# Patient Record
Sex: Female | Born: 1985 | Race: White | Hispanic: No | Marital: Single | Smoking: Current every day smoker
Health system: Southern US, Community
[De-identification: ages and names within clinical notes are randomized; demographics above are authoritative.]

## PROBLEM LIST (undated history)

## (undated) DIAGNOSIS — D649 Anemia, unspecified: Secondary | ICD-10-CM

## (undated) DIAGNOSIS — B009 Herpesviral infection, unspecified: Secondary | ICD-10-CM

## (undated) DIAGNOSIS — N920 Excessive and frequent menstruation with regular cycle: Secondary | ICD-10-CM

## (undated) DIAGNOSIS — Z8619 Personal history of other infectious and parasitic diseases: Secondary | ICD-10-CM

## (undated) DIAGNOSIS — K219 Gastro-esophageal reflux disease without esophagitis: Secondary | ICD-10-CM

## (undated) DIAGNOSIS — Z87442 Personal history of urinary calculi: Secondary | ICD-10-CM

## (undated) HISTORY — DX: Excessive and frequent menstruation with regular cycle: N92.0

## (undated) HISTORY — DX: Herpesviral infection, unspecified: B00.9

---

## 2013-01-09 ENCOUNTER — Emergency Department: Payer: Self-pay | Admitting: Emergency Medicine

## 2013-01-09 LAB — COMPREHENSIVE METABOLIC PANEL
Albumin: 4.4 g/dL (ref 3.4–5.0)
Alkaline Phosphatase: 69 U/L (ref 50–136)
Co2: 25 mmol/L (ref 21–32)
Creatinine: 0.91 mg/dL (ref 0.60–1.30)
EGFR (Non-African Amer.): >60
Glucose: 90 mg/dL (ref 65–99)
Potassium: 3.8 mmol/L (ref 3.5–5.1)
Sodium: 138 mmol/L (ref 136–145)

## 2013-01-09 LAB — URINALYSIS, COMPLETE
Ketone: NEGATIVE
Ph: 8 (ref 4.5–8.0)
Specific Gravity: 1.016 (ref 1.003–1.030)
Squamous Epithelial: 21
WBC UR: 21 /HPF (ref 0–5)

## 2013-01-09 LAB — CBC
HCT: 46.4 % (ref 35.0–47.0)
MCH: 29.7 pg (ref 26.0–34.0)
RBC: 5.21 10*6/uL — ABNORMAL HIGH (ref 3.80–5.20)
WBC: 11.3 10*3/uL — ABNORMAL HIGH (ref 3.6–11.0)

## 2013-01-09 LAB — LIPASE, BLOOD: Lipase: 107 U/L (ref 73–393)

## 2013-01-09 LAB — PREGNANCY, URINE: Pregnancy Test, Urine: NEGATIVE m[IU]/mL

## 2015-09-22 ENCOUNTER — Encounter: Payer: Self-pay | Admitting: Emergency Medicine

## 2015-09-22 ENCOUNTER — Emergency Department
Admission: EM | Admit: 2015-09-22 | Discharge: 2015-09-22 | Disposition: A | Discharge status: Home / Self Care | Payer: Managed Care, Other (non HMO) | Attending: Emergency Medicine | Admitting: Emergency Medicine

## 2015-09-22 DIAGNOSIS — Z720 Tobacco use: Secondary | ICD-10-CM | POA: Diagnosis not present

## 2015-09-22 DIAGNOSIS — L509 Urticaria, unspecified: Secondary | ICD-10-CM | POA: Diagnosis not present

## 2015-09-22 DIAGNOSIS — F419 Anxiety disorder, unspecified: Secondary | ICD-10-CM | POA: Insufficient documentation

## 2015-09-22 DIAGNOSIS — R21 Rash and other nonspecific skin eruption: Secondary | ICD-10-CM | POA: Diagnosis present

## 2015-09-22 NOTE — Discharge Instructions (Signed)
Hives  Hives are itchy, red, puffy (swollen) areas of the skin. Hives can change in size and location on your body. Hives can come and go for hours, days, or weeks. Hives do not spread from person to person (noncontagious). Scratching, exercise, and stress can make your hives worse.  HOME CARE  · Avoid things that cause your hives (triggers).  · Take antihistamine medicines as told by your doctor. Do not drive while taking an antihistamine.  · Take any other medicines for itching as told by your doctor.  · Wear loose-fitting clothing.  · Keep all doctor visits as told.  GET HELP RIGHT AWAY IF:   · You have a fever.  · Your tongue or lips are puffy.  · You have trouble breathing or swallowing.  · You feel tightness in the throat or chest.  · You have belly (abdominal) pain.  · You have lasting or severe itching that is not helped by medicine.  · You have painful or puffy joints.  These problems may be the first sign of a life-threatening allergic reaction. Call your local emergency services (911 in [place]  MAKE SURE YOU:   · Understand these instructions.  · Will watch your condition.  · Will get help right away if you are not doing well or get worse.  Document Released: 09/14/2008 Document Revised: 06/06/2012 Document Reviewed: 02/29/2012  [employer]® Patient Information ©2015 [employer]. This information is not intended to replace advice given to you by your health care provider. Make sure you discuss any questions you have with your health care provider.

## 2015-09-22 NOTE — ED Notes (Signed)
Patient ambulatory to triage with steady gait, without difficulty or distress noted; pt reports itchy rash x 3 days with unknown cause; st relieved by benadryl but reoccurs

## 2015-09-22 NOTE — ED Provider Notes (Signed)
[facility] Emergency Department Provider Note  ____________________________________________  Time seen: 7 AM  I have reviewed the triage vital signs and the nursing notes.   HISTORY  Chief Complaint Allergic Reaction    HPI Teresa Bond is a 29 y.o. female who presents with a rash. She reports she just moved to [place] from [place] 3 days ago and developed an itchy red rash which has progressed to spread all over her body. She reports improved significantly with Benadryl. She has no history of allergic reactions. She has not had recent antibiotics. She does not know the trigger for this. No fevers no chills no intraoral swelling. No shortness of breath.     History reviewed. No pertinent past medical history.  There are no active problems to display for this patient.   Past Surgical History  Procedure Laterality Date  . Cesarean section      No current outpatient prescriptions on file.  Allergies Review of patient's allergies indicates no known allergies.  No family history on file.  Social History Social History  Substance Use Topics  . Smoking status: Current Every Day Smoker -- 0.50 packs/day    Types: Cigarettes  . Smokeless tobacco: None  . Alcohol Use: None    Review of Systems  Constitutional: Negative for fever. Eyes: Negative for visual changes. ENT: Negative for sore throat negative for swelling Cardiovascular: Negative for chest pain. Respiratory: Negative for shortness of breath. Gastrointestinal: Negative for abdominal pain, vomiting and diarrhea. Genitourinary: Negative for dysuria. Musculoskeletal: Negative for back pain. Skin: Positive for rash Neurological: Negative for headaches or focal weakness Psychiatric: Mild anxiety    ____________________________________________   PHYSICAL EXAM:  VITAL SIGNS: ED Triage Vitals  Enc Vitals Group     BP 09/22/15 0614 108/63 mmHg     Pulse Rate 09/22/15 0614 90     Resp 09/22/15 0614 18     Temp 09/22/15 0614 97.9 F (36.6 C)     Temp Source 09/22/15 0614 Oral     SpO2 09/22/15 0614 99 %     Weight 09/22/15 0614 135 lb (61.236 kg)     Height 09/22/15 0614  (1.626 m)     Head Cir --      Peak Flow --      Pain Score 09/22/15 0636 1     Pain Loc --      Pain Edu? --      Excl. in GC? --      Constitutional: Alert and oriented. Well appearing and in no distress. Eyes: Conjunctivae are normal.  ENT   Head: Normocephalic and atraumatic.   Mouth/Throat: Mucous membranes are moist. No stridor. No intraoral swelling Cardiovascular: Normal rate, regular rhythm. Normal and symmetric distal pulses are present in all extremities. No murmurs, rubs, or gallops. Respiratory: Normal respiratory effort without tachypnea nor retractions. Breath sounds are clear and equal bilaterally.  Gastrointestinal: Soft and non-tender in all quadrants. No distention. There is no CVA tenderness. Genitourinary: deferred Musculoskeletal: Nontender with normal range of motion in all extremities. No lower extremity tenderness nor edema. Neurologic:  Normal speech and language. No gross focal neurologic deficits are appreciated. Skin:  Skin is warm, dry. Patient with diffuse urticarial rash that is pruritic. Psychiatric: Mood and affect are normal. Patient exhibits appropriate insight and judgment.  ____________________________________________    LABS (pertinent positives/negatives)  Labs Reviewed - No data to display  ____________________________________________   EKG  None  ____________________________________________    RADIOLOGY  I have personally reviewed any xrays that were ordered on this patient:   ____________________________________________   PROCEDURES  Procedure(s) performed: none  Critical Care performed: none  ____________________________________________   INITIAL IMPRESSION / ASSESSMENT AND PLAN / ED COURSE  Pertinent labs  & imaging results that were available during my care of the patient were reviewed by me and considered in my medical decision making (see chart for details).  Patient well-appearing. Rash is definitely consistent with urticaria especially given improvement with Benadryl. Patient is approximately one month pregnant (she has no abdominal pain or vaginal bleeding ) and prednisone is not indicated in the first trimester pregnancy. I have asked her to continue taking Benadryl as needed for itching. She will follow-up. Return precautions cautions discussed including intraoral swelling or shortness of breath  ____________________________________________   FINAL CLINICAL IMPRESSION(S) / ED DIAGNOSES  Final diagnoses:  Urticaria     Jene Every, MD 09/22/15 [phone]

## 2015-10-13 ENCOUNTER — Emergency Department
Admission: EM | Admit: 2015-10-13 | Discharge: 2015-10-14 | Disposition: A | Discharge status: Home / Self Care | Payer: Managed Care, Other (non HMO) | Attending: Emergency Medicine | Admitting: Emergency Medicine

## 2015-10-13 ENCOUNTER — Encounter: Payer: Self-pay | Admitting: *Deleted

## 2015-10-13 ENCOUNTER — Emergency Department: Payer: Managed Care, Other (non HMO)

## 2015-10-13 DIAGNOSIS — F17210 Nicotine dependence, cigarettes, uncomplicated: Secondary | ICD-10-CM | POA: Diagnosis not present

## 2015-10-13 DIAGNOSIS — O209 Hemorrhage in early pregnancy, unspecified: Secondary | ICD-10-CM | POA: Diagnosis present

## 2015-10-13 DIAGNOSIS — Z3A00 Weeks of gestation of pregnancy not specified: Secondary | ICD-10-CM | POA: Diagnosis not present

## 2015-10-13 DIAGNOSIS — F419 Anxiety disorder, unspecified: Secondary | ICD-10-CM | POA: Insufficient documentation

## 2015-10-13 DIAGNOSIS — O99330 Smoking (tobacco) complicating pregnancy, unspecified trimester: Secondary | ICD-10-CM | POA: Diagnosis not present

## 2015-10-13 DIAGNOSIS — O99340 Other mental disorders complicating pregnancy, unspecified trimester: Secondary | ICD-10-CM | POA: Insufficient documentation

## 2015-10-13 DIAGNOSIS — O200 Threatened abortion: Secondary | ICD-10-CM

## 2015-10-13 DIAGNOSIS — O039 Complete or unspecified spontaneous abortion without complication: Secondary | ICD-10-CM

## 2015-10-13 LAB — HCG, QUANTITATIVE, PREGNANCY: hCG, Beta Chain, Quant, S: 10260 m[IU]/mL — ABNORMAL HIGH (ref <5)

## 2015-10-13 LAB — CBC
HEMATOCRIT: 37 % (ref 35.0–47.0)
HEMOGLOBIN: 12.2 g/dL (ref 12.0–16.0)
MCH: 25.4 pg — ABNORMAL LOW (ref 26.0–34.0)
MCHC: 32.8 g/dL (ref 32.0–36.0)
MCV: 77.4 fL — AB (ref 80.0–100.0)
Platelets: 270 10*3/uL (ref 150–440)
RBC: 4.79 MIL/uL (ref 3.80–5.20)
RDW: 21.6 % — AB (ref 11.5–14.5)
WBC: 9.8 10*3/uL (ref 3.6–11.0)

## 2015-10-13 LAB — ABO/RH: ABO/RH(D): A POS

## 2015-10-13 MED ORDER — ONDANSETRON HCL 4 MG/2ML IJ SOLN
INTRAMUSCULAR | Status: AC
  Administered 2015-10-13: 4 mg via INTRAVENOUS
  Filled 2015-10-13: qty 2

## 2015-10-13 MED ORDER — MORPHINE SULFATE (PF) 2 MG/ML IV SOLN
2.0000 mg | Freq: Once | INTRAVENOUS | Status: AC
  Administered 2015-10-13: 2 mg via INTRAVENOUS
  Filled 2015-10-13: qty 1

## 2015-10-13 MED ORDER — MORPHINE SULFATE (PF) 2 MG/ML IV SOLN
2.0000 mg | Freq: Once | INTRAVENOUS | Status: AC
  Administered 2015-10-13: 2 mg via INTRAVENOUS

## 2015-10-13 MED ORDER — ONDANSETRON HCL 4 MG/2ML IJ SOLN
4.0000 mg | Freq: Once | INTRAMUSCULAR | Status: AC
  Administered 2015-10-13: 4 mg via INTRAVENOUS

## 2015-10-13 MED ORDER — MORPHINE SULFATE (PF) 2 MG/ML IV SOLN
INTRAVENOUS | Status: AC
  Administered 2015-10-13: 2 mg via INTRAVENOUS
  Filled 2015-10-13: qty 1

## 2015-10-13 NOTE — ED Provider Notes (Signed)
[facility] Emergency Department Provider Note  ____________________________________________  Time seen: On arrival  I have reviewed the triage vital signs and the nursing notes.   HISTORY  Chief Complaint Vaginal Bleeding    HPI Teresa Bond is a 29 y.o. female who presents with vaginal bleeding. She notes that she is approximately 2 months pregnant and has not had any prenatall care. She is a G4 P2 with a prior miscarriage. She notes that she is passing significant blood and having lower abdominal and lower back cramping. She denies fevers chills. No nausea no vomiting. She reports this feels different than her last miscarriage     History reviewed. No pertinent past medical history.  There are no active problems to display for this patient.   Past Surgical History  Procedure Laterality Date  . Cesarean section      No current outpatient prescriptions on file.  Allergies Review of patient's allergies indicates no known allergies.  No family history on file.  Social History Social History  Substance Use Topics  . Smoking status: Current Every Day Smoker -- 0.50 packs/day    Types: Cigarettes  . Smokeless tobacco: None  . Alcohol Use: Yes    Review of Systems  Constitutional: Negative for fever. Eyes: Negative for visual changes. ENT: Negative for sore throat Cardiovascular: Negative for chest pain. Respiratory: Negative for shortness of breath. Gastrointestinal: positive for lower abdominal cramping Genitourinary: Negative for dysuria.positive for vaginal bleeding Musculoskeletal: Negative for back pain. Skin: Negative for rash. Neurological: Negative for headaches or focal weakness Psychiatric:positive for anxiety    ____________________________________________   PHYSICAL EXAM:  VITAL SIGNS: ED Triage Vitals  Enc Vitals Group     BP 10/13/15 2104 132/73 mmHg     Pulse Rate 10/13/15 2104 81     Resp 10/13/15 2104 18      Temp 10/13/15 2104 98.3 F (36.8 C)     Temp Source 10/13/15 2104 Oral     SpO2 10/13/15 2104 100 %     Weight 10/13/15 2104 135 lb (61.236 kg)     Height 10/13/15 2104  (1.626 m)     Head Cir --      Peak Flow --      Pain Score 10/13/15 2104 10     Pain Loc --      Pain Edu? --      Excl. in GC? --      Constitutional: Alert and oriented. Well appearing and in no distress. Eyes: Conjunctivae are normal.  ENT   Head: Normocephalic and atraumatic.   Mouth/Throat: Mucous membranes are moist. Cardiovascular: Normal rate, regular rhythm. Normal and symmetric distal pulses are present in all extremities. No murmurs, rubs, or gallops. Respiratory: Normal respiratory effort without tachypnea nor retractions. Breath sounds are clear and equal bilaterally.  Gastrointestinal: Soft and non-tender in all quadrants. No distention. There is no CVA tenderness. Genitourinary: deferred Musculoskeletal: Nontender with normal range of motion in all extremities. No lower extremity tenderness nor edema. Neurologic:  Normal speech and language. No gross focal neurologic deficits are appreciated. Skin:  Skin is warm, dry and intact. No rash noted. Psychiatric: patient is anxious and uncomfortable  ____________________________________________    LABS (pertinent positives/negatives)  Labs Reviewed  HCG, QUANTITATIVE, PREGNANCY - Abnormal; Notable for the following:    hCG, Beta Chain, Quant, S 10260 (*)    All other components within normal limits  CBC - Abnormal; Notable for the following:    MCV  77.4 (*)    MCH 25.4 (*)    RDW 21.6 (*)    All other components within normal limits  ABO/RH    ____________________________________________   EKG  None  ____________________________________________    RADIOLOGY I have personally reviewed any xrays that were ordered on this patient: Ultrasound  pending  ____________________________________________   PROCEDURES  Procedure(s) performed: none  Critical Care performed: none  ____________________________________________   INITIAL IMPRESSION / ASSESSMENT AND PLAN / ED COURSE  Pertinent labs & imaging results that were available during my care of the patient were reviewed by me and considered in my medical decision making (see chart for details).  Patient presents with vaginal bleeding approximately [redacted] weeks pregnant. She is having significant cramping. The symptoms are concerning for miscarriage nand I have communicated this to the patient. We'll obtain ultrasound to evaluate  Patient is so uncomfortable that she is required 2 doses of morphine. She reports she continues to bleed.  Ultrasound pending. I will sign out tto Dr. Manson PasseyBrown and asked him to follow up the ultrasound results  ____________________________________________   FINAL CLINICAL IMPRESSION(S) / ED DIAGNOSES  Threatened miscarriage   Jene Everyobert Kiyomi Pallo, MD 10/13/15 2315

## 2015-10-13 NOTE — ED Notes (Signed)
Pt reports bleeding x 2 days with clots starting today. Pt reports being approx 2 months pregnant with no prenatal care due to recent move.

## 2015-10-13 NOTE — ED Notes (Signed)
Pt crying in room, MD at bedside. Pt continues to cry, mother has been out of room several times.

## 2015-10-13 NOTE — ED Notes (Addendum)
Pt says for two days she has had dark red vaginal bleeding with large clots. About 2 hours ago she experienced a "gushing" of blood. Having lower abdominal and lower back pain. Pt is pregnant, EDD 02/25/2016, has not established prenatal care yet d/t recent move. Pt reports changing her pad approx every 2 hours since the bleeding started. G4P2 Pt was small amount of bright red blood, no clotting noted on sanitary pad in triage.

## 2015-10-14 NOTE — Discharge Instructions (Signed)
Miscarriage  A miscarriage is the sudden loss of an unborn baby (fetus) before the 20th week of pregnancy. Most miscarriages happen in the first 3 months of pregnancy. Sometimes, it happens before a woman even knows she is pregnant. A miscarriage is also called a "spontaneous miscarriage" or "early pregnancy loss." Having a miscarriage can be an emotional experience. Talk with your caregiver about any questions you may have about miscarrying, the grieving process, and your future pregnancy plans.  CAUSES    Problems with the fetal chromosomes that make it impossible for the baby to develop normally. Problems with the baby's genes or chromosomes are most often the result of errors that occur, by chance, as the embryo divides and grows. The problems are not inherited from the parents.   Infection of the cervix or uterus.    Hormone problems.    Problems with the cervix, such as having an incompetent cervix. This is when the tissue in the cervix is not strong enough to hold the pregnancy.    Problems with the uterus, such as an abnormally shaped uterus, uterine fibroids, or congenital abnormalities.    Certain medical conditions.    Smoking, drinking alcohol, or taking illegal drugs.    Trauma.   Often, the cause of a miscarriage is unknown.   SYMPTOMS    Vaginal bleeding or spotting, with or without cramps or pain.   Pain or cramping in the abdomen or lower back.   Passing fluid, tissue, or blood clots from the vagina.  DIAGNOSIS   Your caregiver will perform a physical exam. You may also have an ultrasound to confirm the miscarriage. Blood or urine tests may also be ordered.  TREATMENT    Sometimes, treatment is not necessary if you naturally pass all the fetal tissue that was in the uterus. If some of the fetus or placenta remains in the body (incomplete miscarriage), tissue left behind may become infected and must be removed. Usually, a dilation and curettage (D and C) procedure is performed.  During a D and C procedure, the cervix is widened (dilated) and any remaining fetal or placental tissue is gently removed from the uterus.   Antibiotic medicines are prescribed if there is an infection. Other medicines may be given to reduce the size of the uterus (contract) if there is a lot of bleeding.   If you have Rh negative blood and your baby was Rh positive, you will need a Rh immunoglobulin shot. This shot will protect any future baby from having Rh blood problems in future pregnancies.  HOME CARE INSTRUCTIONS    Your caregiver may order bed rest or may allow you to continue light activity. Resume activity as directed by your caregiver.   Have someone help with home and family responsibilities during this time.    Keep track of the number of sanitary pads you use each day and how soaked (saturated) they are. Write down this information.    Do not use tampons. Do not douche or have sexual intercourse until approved by your caregiver.    Only take over-the-counter or prescription medicines for pain or discomfort as directed by your caregiver.    Do not take aspirin. Aspirin can cause bleeding.    Keep all follow-up appointments with your caregiver.    If you or your partner have problems with grieving, talk to your caregiver or seek counseling to help cope with the pregnancy loss. Allow enough time to grieve before trying to get pregnant again.     SEEK IMMEDIATE MEDICAL CARE IF:    You have severe cramps or pain in your back or abdomen.   You have a fever.   You pass large blood clots (walnut-sized or larger) ortissue from your vagina. Save any tissue for your caregiver to inspect.    Your bleeding increases.    You have a thick, bad-smelling vaginal discharge.   You become lightheaded, weak, or you faint.    You have chills.   MAKE SURE YOU:   Understand these instructions.   Will watch your condition.   Will get help right away if you are not doing well or get worse.     This  information is not intended to replace advice given to you by your health care provider. Make sure you discuss any questions you have with your health care provider.     Document Released: 06/01/2001 Document Revised: 04/02/2013 Document Reviewed: 01/25/2012  Elsevier Interactive Patient Education 2016 [employer]

## 2015-10-14 NOTE — ED Notes (Signed)

## 2016-09-16 IMAGING — US US OB COMP LESS 14 WK
1 series · 13 of 28 positions shown · non-contrast
Comparison: None.

CLINICAL DATA: Acute onset of vaginal bleeding and pelvic cramping.
Initial encounter.

EXAM:
OBSTETRIC <14 WK US AND TRANSVAGINAL OB US
TECHNIQUE: Both transabdominal and transvaginal ultrasound examinations were
performed for complete evaluation of the gestation as well as the
maternal uterus, adnexal regions, and pelvic cul-de-sac.
Transvaginal technique was performed to assess early pregnancy.

[Series 1: us ob comp less 14 wk · 0.24mm/px · 13 of 71 slices shown]
[im 3/71]
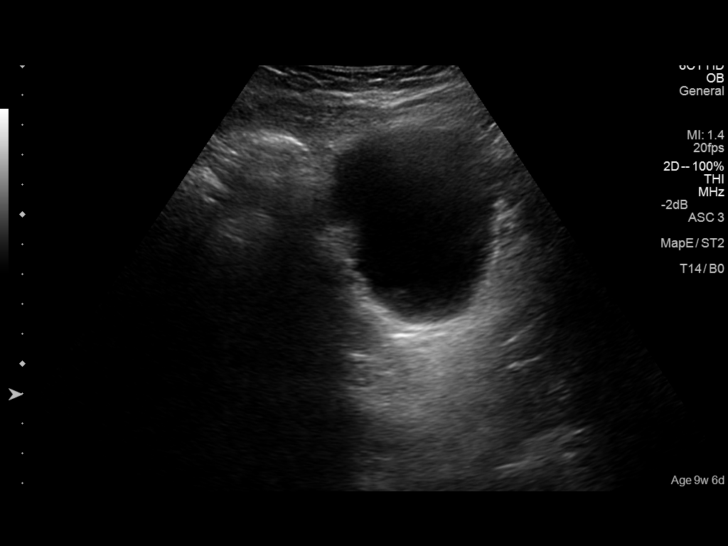
[im 8/71]
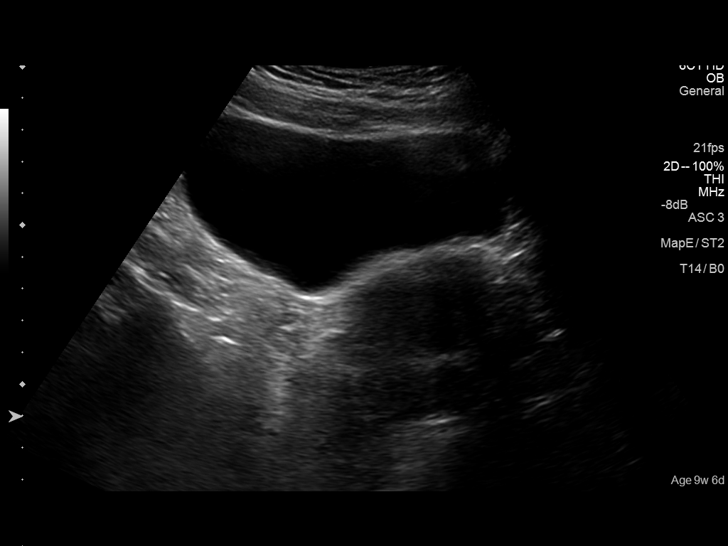
[im 13/71]
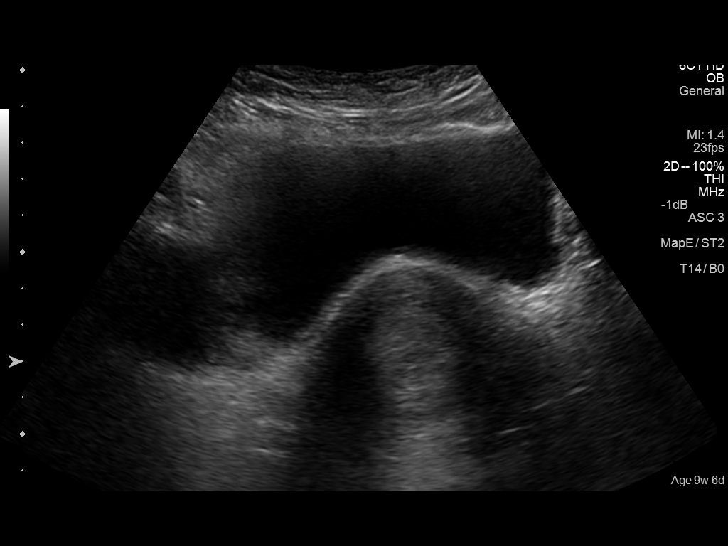
[im 19/71]
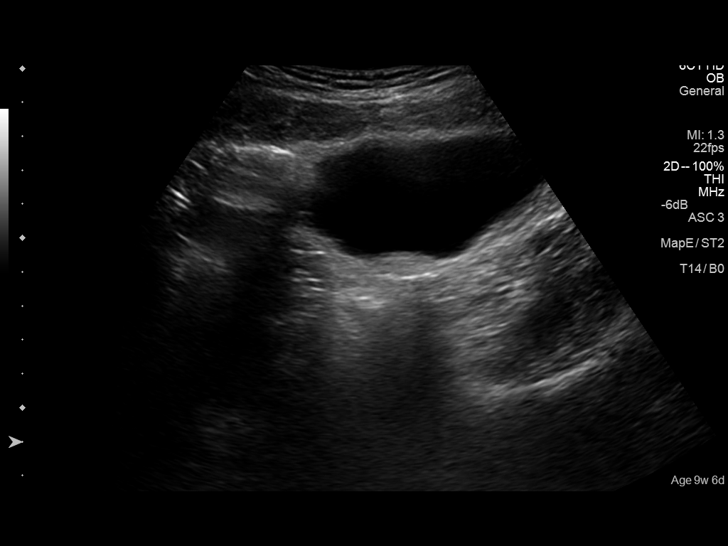
[im 24/71]
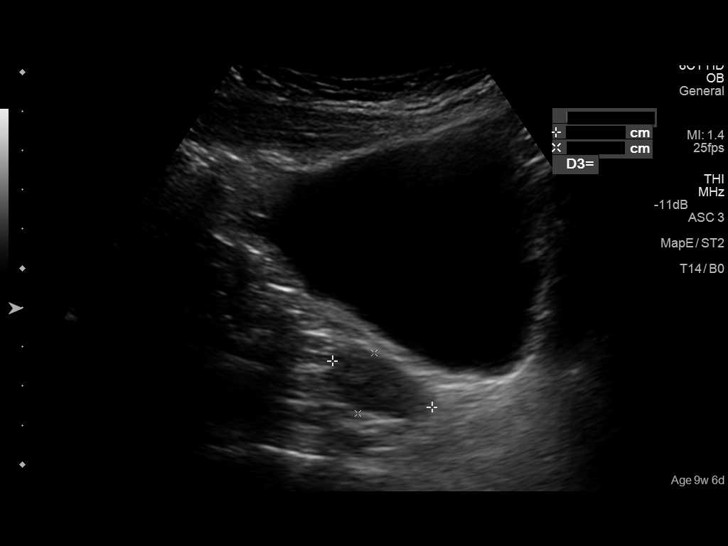
[im 29/71]
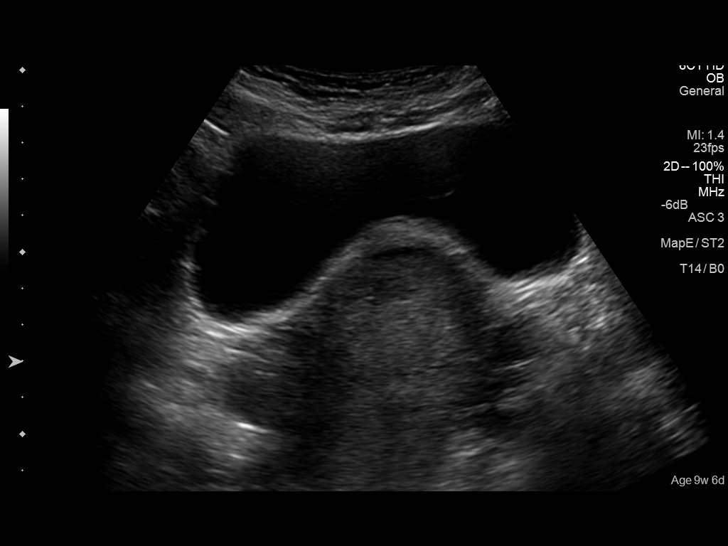
[im 37/71]
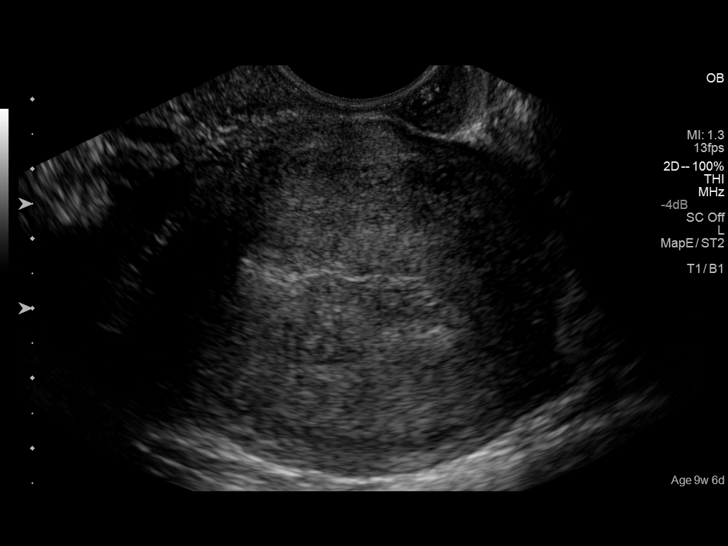
[im 42/71]
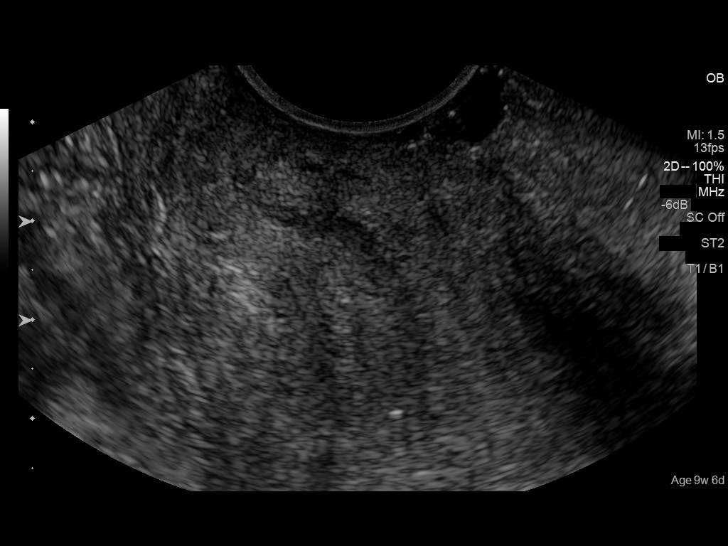
[im 47/71]
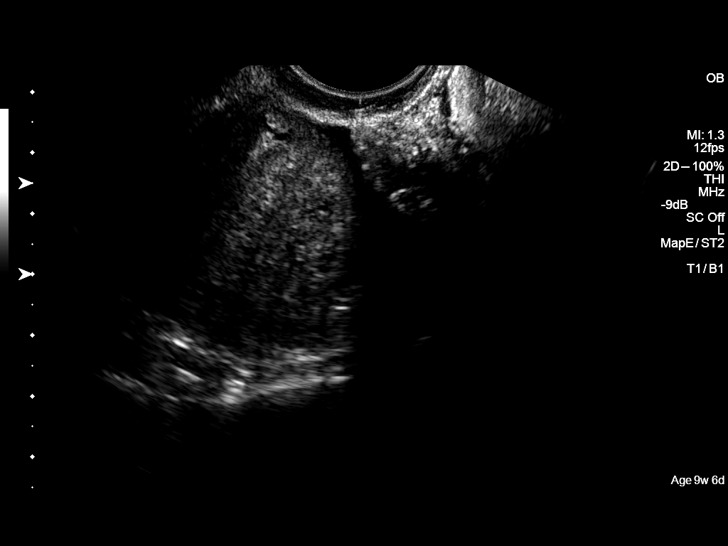
[im 52/71]
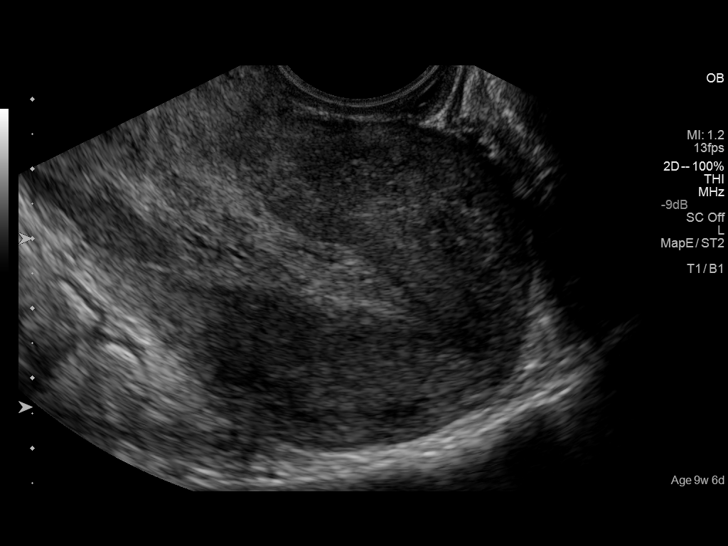
[im 58/71]
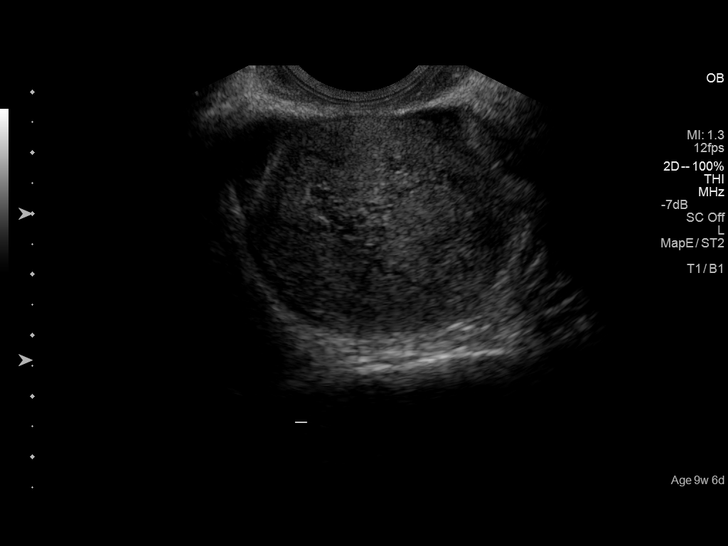
[im 63/71]
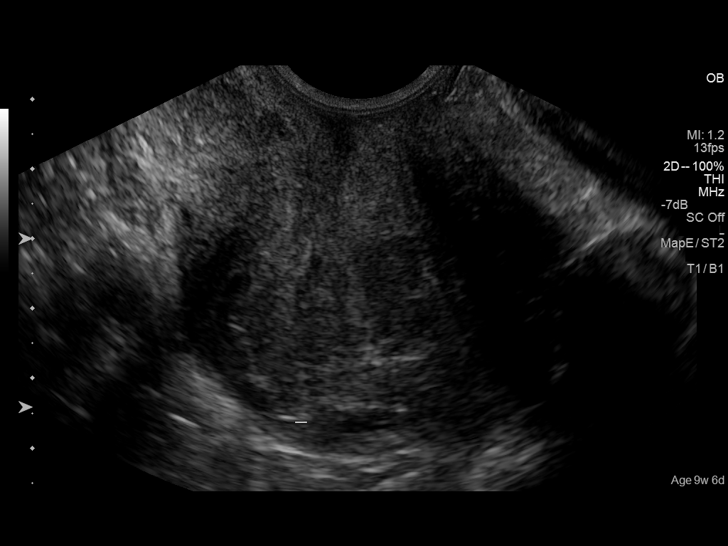
[im 68/71]
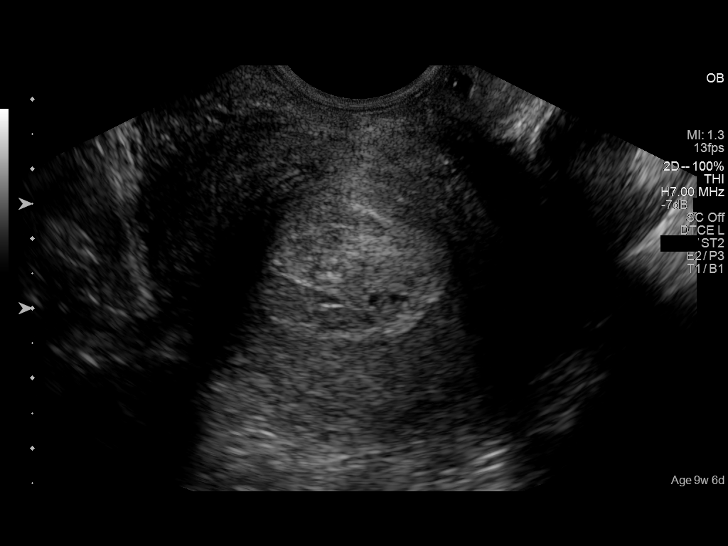

[13 of 28 positions shown; findings below may reference images not displayed]

FINDINGS: Intrauterine gestational sac: None seen.

Yolk sac:  N/A

Embryo:  N/A

Maternal uterus/adnexae: No subchorionic hemorrhage is noted. There
is no evidence of retained products of conception. The uterus is
retroverted in nature. The endometrial echo complex is thickened,
measuring up to 1.6 cm, with mildly echogenic material extending
into the cervix, likely reflecting clot and blood.

The ovaries are unremarkable in appearance. The right ovary measures
assess 2.8 x 1.6 x 2.2 cm, while the left ovary measures 2.1 x 1.4 x
1.8 cm. No suspicious adnexal masses are seen; there is no evidence
for ovarian torsion.

No free fluid is seen within the pelvic cul-de-sac.
IMPRESSION: No intrauterine gestational sac seen. Blood and clot noted within
the endometrial canal and cervix. No evidence for retained products
of conception. Findings are compatible with recent spontaneous
abortion.

## 2018-01-12 ENCOUNTER — Other Ambulatory Visit: Payer: Self-pay

## 2018-01-12 ENCOUNTER — Emergency Department: Payer: 59

## 2018-01-12 ENCOUNTER — Emergency Department
Admission: EM | Admit: 2018-01-12 | Discharge: 2018-01-12 | Disposition: A | Discharge status: Home / Self Care | Payer: 59 | Attending: Emergency Medicine | Admitting: Emergency Medicine

## 2018-01-12 DIAGNOSIS — B373 Candidiasis of vulva and vagina: Secondary | ICD-10-CM | POA: Insufficient documentation

## 2018-01-12 DIAGNOSIS — R1030 Lower abdominal pain, unspecified: Secondary | ICD-10-CM | POA: Insufficient documentation

## 2018-01-12 DIAGNOSIS — F17210 Nicotine dependence, cigarettes, uncomplicated: Secondary | ICD-10-CM | POA: Diagnosis not present

## 2018-01-12 DIAGNOSIS — Z3A09 9 weeks gestation of pregnancy: Secondary | ICD-10-CM | POA: Diagnosis not present

## 2018-01-12 DIAGNOSIS — N2889 Other specified disorders of kidney and ureter: Secondary | ICD-10-CM

## 2018-01-12 DIAGNOSIS — O99331 Smoking (tobacco) complicating pregnancy, first trimester: Secondary | ICD-10-CM | POA: Insufficient documentation

## 2018-01-12 DIAGNOSIS — O9989 Other specified diseases and conditions complicating pregnancy, childbirth and the puerperium: Secondary | ICD-10-CM | POA: Insufficient documentation

## 2018-01-12 DIAGNOSIS — O210 Mild hyperemesis gravidarum: Secondary | ICD-10-CM | POA: Diagnosis not present

## 2018-01-12 DIAGNOSIS — O23591 Infection of other part of genital tract in pregnancy, first trimester: Secondary | ICD-10-CM | POA: Diagnosis not present

## 2018-01-12 DIAGNOSIS — B3731 Acute candidiasis of vulva and vagina: Secondary | ICD-10-CM

## 2018-01-12 DIAGNOSIS — O219 Vomiting of pregnancy, unspecified: Secondary | ICD-10-CM | POA: Diagnosis present

## 2018-01-12 DIAGNOSIS — F1290 Cannabis use, unspecified, uncomplicated: Secondary | ICD-10-CM | POA: Insufficient documentation

## 2018-01-12 DIAGNOSIS — O99321 Drug use complicating pregnancy, first trimester: Secondary | ICD-10-CM | POA: Insufficient documentation

## 2018-01-12 LAB — CBC
HEMATOCRIT: 38.9 % (ref 35.0–47.0)
HEMOGLOBIN: 13.5 g/dL (ref 12.0–16.0)
MCH: 30.5 pg (ref 26.0–34.0)
MCHC: 34.7 g/dL (ref 32.0–36.0)
MCV: 87.9 fL (ref 80.0–100.0)
Platelets: 230 10*3/uL (ref 150–440)
RBC: 4.42 MIL/uL (ref 3.80–5.20)
RDW: 13.6 % (ref 11.5–14.5)
WBC: 7.4 10*3/uL (ref 3.6–11.0)

## 2018-01-12 LAB — URINALYSIS, COMPLETE (UACMP) WITH MICROSCOPIC
BACTERIA UA: NONE SEEN
BILIRUBIN URINE: NEGATIVE
Bilirubin Urine: NEGATIVE
Glucose, UA: NEGATIVE mg/dL
Glucose, UA: NEGATIVE mg/dL
HGB URINE DIPSTICK: NEGATIVE
KETONES UR: 5 mg/dL — AB
Ketones, ur: 20 mg/dL — AB
NITRITE: NEGATIVE
NITRITE: NEGATIVE
PH: 6 (ref 5.0–8.0)
PROTEIN: 30 mg/dL — AB
PROTEIN: NEGATIVE mg/dL
Specific Gravity, Urine: 1.019 (ref 1.005–1.030)
Specific Gravity, Urine: 1.016 (ref 1.005–1.030)
pH: 6 (ref 5.0–8.0)

## 2018-01-12 LAB — HCG, QUANTITATIVE, PREGNANCY: hCG, Beta Chain, Quant, S: 261744 m[IU]/mL — ABNORMAL HIGH (ref <5)

## 2018-01-12 LAB — POCT PREGNANCY, URINE: Preg Test, Ur: POSITIVE — AB

## 2018-01-12 LAB — WET PREP, GENITAL
Clue Cells Wet Prep HPF POC: NONE SEEN
SPERM: NONE SEEN
TRICH WET PREP: NONE SEEN

## 2018-01-12 LAB — COMPREHENSIVE METABOLIC PANEL
ALBUMIN: 4 g/dL (ref 3.5–5.0)
ALT: 8 U/L — ABNORMAL LOW (ref 14–54)
ANION GAP: 7 (ref 5–15)
AST: 18 U/L (ref 15–41)
Alkaline Phosphatase: 43 U/L (ref 38–126)
BILIRUBIN TOTAL: 0.7 mg/dL (ref 0.3–1.2)
BUN: 9 mg/dL (ref 6–20)
CO2: 21 mmol/L — ABNORMAL LOW (ref 22–32)
Calcium: 9.3 mg/dL (ref 8.9–10.3)
Chloride: 107 mmol/L (ref 101–111)
Creatinine, Ser: 0.67 mg/dL (ref 0.44–1.00)
GFR calc Af Amer: >60 mL/min (ref >60)
Glucose, Bld: 92 mg/dL (ref 65–99)
POTASSIUM: 3.7 mmol/L (ref 3.5–5.1)
Sodium: 135 mmol/L (ref 135–145)
TOTAL PROTEIN: 7 g/dL (ref 6.5–8.1)

## 2018-01-12 LAB — CHLAMYDIA/NGC RT PCR (ARMC ONLY)
Chlamydia Tr: NOT DETECTED
N gonorrhoeae: NOT DETECTED

## 2018-01-12 LAB — ABO/RH: ABO/RH(D): A POS

## 2018-01-12 LAB — LIPASE, BLOOD: Lipase: 19 U/L (ref 11–51)

## 2018-01-12 MED ORDER — VITAMIN B-6 50 MG PO TABS
50.0000 mg | ORAL_TABLET | Freq: Every day | ORAL | Status: DC
  Administered 2018-01-12: 50 mg via ORAL
  Filled 2018-01-12: qty 1

## 2018-01-12 MED ORDER — SODIUM CHLORIDE 0.9 % IV BOLUS (SEPSIS)
1000.0000 mL | Freq: Once | INTRAVENOUS | Status: AC
  Administered 2018-01-12: 1000 mL via INTRAVENOUS

## 2018-01-12 MED ORDER — MICONAZOLE NITRATE 100 MG VA SUPP: 100.0000 mg | Freq: Every day | VAGINAL | 0 refills | Status: DC

## 2018-01-12 MED ORDER — PROMETHAZINE HCL 25 MG/ML IJ SOLN
12.5000 mg | Freq: Once | INTRAMUSCULAR | Status: AC
  Administered 2018-01-12: 12.5 mg via INTRAVENOUS
  Filled 2018-01-12: qty 1

## 2018-01-12 MED ORDER — TERCONAZOLE 80 MG VA SUPP: 80.0000 mg | Freq: Every day | VAGINAL | 0 refills | Status: DC

## 2018-01-12 MED ORDER — ACETAMINOPHEN 325 MG PO TABS
650.0000 mg | ORAL_TABLET | Freq: Once | ORAL | Status: AC
  Administered 2018-01-12: 650 mg via ORAL
  Filled 2018-01-12: qty 2

## 2018-01-12 MED ORDER — VITAMIN B-6 50 MG PO TABS: 50.0000 mg | ORAL_TABLET | Freq: Three times a day (TID) | ORAL | 0 refills | Status: DC | PRN

## 2018-01-12 NOTE — Discharge Instructions (Signed)
Today your ultrasound showed a possible mass in your left kidney, which is likely benign.  This will need to be followed up with an additional ultrasound; please talk to your physician about this.  Please take a clear liquid diet for the next 12 hours, then advance to bland diet as tolerated.  You may take your previously prescribed Zofran for nausea and vomiting, as well as the vitamin B6.  Please make a follow-up appointment with your OB/GYN, Dr. Valentino Saxonherry.  She will be able to review your ultrasound results.  Stop using marijuana.  Return to the emergency department if you develop severe pain, fever, lightheadedness or fainting, vaginal bleeding, or any other symptoms concerning to you.

## 2018-01-12 NOTE — ED Triage Notes (Addendum)
Pt presents to ED with c/o white vaginal discharge, sharp lower abd pain that started tonight after vomiting. pt reports she has been vomiting the past couple of weeks but states has gotten worse the past few days. Pt is approx 2 months pregnant.

## 2018-01-12 NOTE — ED Notes (Signed)
Dr Sharma Covertnorman at bedside to update pt and family

## 2018-01-12 NOTE — ED Notes (Signed)
Pt aware of need for urine specimen. Has wipes and aware of how to do clean catch.

## 2018-01-12 NOTE — ED Provider Notes (Signed)
[facility] Emergency Department Provider Note  ____________________________________________  Time seen: Approximately 7:19 AM  I have reviewed the triage vital signs and the nursing notes.   HISTORY  Chief Complaint Emesis During Pregnancy; Abdominal Pain; and Vaginal Discharge    HPI Teresa Bond is a 32 y.o. female G5P2A2 approximately [redacted] weeks pregnant presenting w/ emesis, abd pain and vaginal discharge.  The pt reports that for several weeks he has been experiencing multiple daily episodes of nausea and vomiting, but feels that this has been worse over the last 2 or 3 days.  She has tried marijuana without improvement.  She was given a prescription for Zofran by her gynecologist, but did not try it because she was concerned about birth defects that she read about on the Internet.  The patient also reports that for the past several weeks she has post vomiting abdominal pain.  She describes a pain that starts in the left side and radiates to the right side.  This does not occur outside of vomiting; she has no associated vaginal bleeding.  She does describe an increased amount of white vaginal discharge.  She has not had any fever or chills, lightheadedness or syncope.  History reviewed. No pertinent past medical history.  There are no active problems to display for this patient.   Past Surgical History:  Procedure Laterality Date  . CESAREAN SECTION        Allergies Patient has no known allergies.  No family history on file.  Social History Social History   Tobacco Use  . Smoking status: Current Every Day Smoker    Packs/day: 0.50    Types: Cigarettes  Substance Use Topics  . Alcohol use: No    Frequency: Never  . Drug use: Yes    Types: Marijuana    Review of Systems Constitutional: No fever/chills.  No lightheadedness or syncope.   Eyes: No visual changes. ENT: No congestion or rhinorrhea. Cardiovascular: Denies chest pain. Denies  palpitations. Respiratory: Denies shortness of breath.  No cough. Gastrointestinal: As of lower abdominal pain.  +nausea, +vomiting.  No diarrhea.  No constipation. Genitourinary: Negative for dysuria.  Positive for white vaginal discharge.  Negative for vaginal bleeding. Musculoskeletal: Negative for back pain. Skin: Negative for rash. Neurological: Negative for headaches. No focal numbness, tingling or weakness.     ____________________________________________   PHYSICAL EXAM:  VITAL SIGNS: ED Triage Vitals  Enc Vitals Group     BP 01/12/18 0613 100/72     Pulse Rate 01/12/18 0613 86     Resp 01/12/18 0613 20     Temp 01/12/18 0613 98.2 F (36.8 C)     Temp Source 01/12/18 0613 Oral     SpO2 01/12/18 0613 98 %     Weight 01/12/18 0619 130 lb (59 kg)     Height 01/12/18 0619 5\' 4"  (1.626 m)     Head Circumference --      Peak Flow --      Pain Score --      Pain Loc --      Pain Edu? --      Excl. in GC? --     Constitutional: Alert and oriented. Well appearing and in no acute distress. Answers questions appropriately. Eyes: Conjunctivae are normal.  EOMI. No scleral icterus. Head: Atraumatic. Nose: No congestion/rhinnorhea. Mouth/Throat: Mucous membranes are dry.  Neck: No stridor.  Supple.  No JVD.  No meningismus. Cardiovascular: Normal rate, regular rhythm. No murmurs, rubs or  gallops.  Respiratory: Normal respiratory effort.  No accessory muscle use or retractions. Lungs CTAB.  No wheezes, rales or ronchi. Gastrointestinal: Soft, nontender and nondistended.  No guarding or rebound.  No peritoneal signs. Genitourinary: Normal-appearing external genitalia without lesions. Normal vaginal exam with physiologic discharge, normal-appearing cervix, normal vaginal wall tissue. Bimanual exam is negative for CMT, adnexal tenderness to palpation, no palpable masses. Musculoskeletal: No LE edema.  Neurologic:  A&Ox3.  Speech is clear.  Face and smile are symmetric.  EOMI.   Moves all extremities well. Skin:  Skin is warm, dry and intact. No rash noted. Psychiatric: Mood and affect are normal. Speech and behavior are normal.  Normal judgement.  ____________________________________________   LABS (all labs ordered are listed, but only abnormal results are displayed)  Labs Reviewed  WET PREP, GENITAL - Abnormal; Notable for the following components:      Result Value   Yeast Wet Prep HPF POC PRESENT (*)    WBC, Wet Prep HPF POC FEW (*)    All other components within normal limits  URINALYSIS, COMPLETE (UACMP) WITH MICROSCOPIC - Abnormal; Notable for the following components:   Color, Urine AMBER (*)    APPearance CLOUDY (*)    Hgb urine dipstick SMALL (*)    Ketones, ur 5 (*)    Protein, ur 30 (*)    Leukocytes, UA MODERATE (*)    Bacteria, UA RARE (*)    Squamous Epithelial / LPF TOO NUMEROUS TO COUNT (*)    All other components within normal limits  HCG, QUANTITATIVE, PREGNANCY - Abnormal; Notable for the following components:   hCG, Beta Chain, Quant, S 409,811 (*)    All other components within normal limits  COMPREHENSIVE METABOLIC PANEL - Abnormal; Notable for the following components:   CO2 21 (*)    ALT 8 (*)    All other components within normal limits  URINALYSIS, COMPLETE (UACMP) WITH MICROSCOPIC - Abnormal; Notable for the following components:   Color, Urine YELLOW (*)    APPearance HAZY (*)    Ketones, ur 20 (*)    Leukocytes, UA TRACE (*)    Squamous Epithelial / LPF 0-5 (*)    All other components within normal limits  POCT PREGNANCY, URINE - Abnormal; Notable for the following components:   Preg Test, Ur POSITIVE (*)    All other components within normal limits  CHLAMYDIA/NGC RT PCR ([facility] ONLY)  URINE CULTURE  LIPASE, BLOOD  CBC  POC URINE PREG, ED  ABO/RH   ____________________________________________  EKG  Not indicated ____________________________________________  RADIOLOGY  US Ob Comp Less 14  Wks  Result Date: 01/12/2018 CLINICAL DATA:  Abdominal pain EXAM: OBSTETRIC <14 WK [place] AND TRANSVAGINAL OB US TECHNIQUE: Both transabdominal and transvaginal ultrasound examinations were performed for complete evaluation of the gestation as well as the maternal uterus, adnexal regions, and pelvic cul-de-sac. Transvaginal technique was performed to assess early pregnancy. COMPARISON:  None. FINDINGS: Intrauterine gestational sac: Single Yolk sac:  Visualized Embryo:  Visualized Cardiac Activity: Visualized Heart Rate: 185 bpm MSD:   mm    w     d CRL:  23.5 mm   9 w   0 d                  [place] EDC: 08/17/2018 Subchorionic hemorrhage:  None visualized. Maternal uterus/adnexae: Complex exophytic mass off the left ovary measures 3.6 x 2.4 x 2.8 cm. This contains cystic components and solid components, some which are echogenic. This  could represent hemorrhagic cyst or dermoid. IMPRESSION: Nine week intrauterine pregnancy. Fetal heart rate 185 beats per minute. Complex exophytic lesion off the left kidney with cystic and solid components. This could represent a hemorrhagic cyst or dermoid cyst. This could be followed with repeat ultrasound after pregnancy. Electronically Signed   By: Charlett Nose M.D.   On: 01/12/2018 08:40   US Ob Transvaginal  Result Date: 01/12/2018 CLINICAL DATA:  Abdominal pain EXAM: OBSTETRIC <14 WK [place] AND TRANSVAGINAL OB US TECHNIQUE: Both transabdominal and transvaginal ultrasound examinations were performed for complete evaluation of the gestation as well as the maternal uterus, adnexal regions, and pelvic cul-de-sac. Transvaginal technique was performed to assess early pregnancy. COMPARISON:  None. FINDINGS: Intrauterine gestational sac: Single Yolk sac:  Visualized Embryo:  Visualized Cardiac Activity: Visualized Heart Rate: 185 bpm MSD:   mm    w     d CRL:  23.5 mm   9 w   0 d                  [place] EDC: 08/17/2018 Subchorionic hemorrhage:  None visualized. Maternal uterus/adnexae: Complex  exophytic mass off the left ovary measures 3.6 x 2.4 x 2.8 cm. This contains cystic components and solid components, some which are echogenic. This could represent hemorrhagic cyst or dermoid. IMPRESSION: Nine week intrauterine pregnancy. Fetal heart rate 185 beats per minute. Complex exophytic lesion off the left kidney with cystic and solid components. This could represent a hemorrhagic cyst or dermoid cyst. This could be followed with repeat ultrasound after pregnancy. Electronically Signed   By: Charlett Nose M.D.   On: 01/12/2018 08:40    ____________________________________________   PROCEDURES  Procedure(s) performed: None  Procedures  Critical Care performed: No ____________________________________________   INITIAL IMPRESSION / ASSESSMENT AND PLAN / ED COURSE  Pertinent labs & imaging results that were available during my care of the patient were reviewed by me and considered in my medical decision making (see chart for details).  32 y.o. female G36P2 A2 approximately [redacted] weeks pregnant presenting with increased emesis, abdominal pain, and white vaginal discharge.  Overall, the patient is hemodynamically stable and afebrile.  We will plan to proceed with STI testing, and ultrasound to confirm intrauterine pregnancy, and a urinalysis to rule out UTI.  Patient will be treated symptomatically, and has already received 500 cc of fluid and Phenergan and is feeling better.  Plan reevaluation for final disposition.  ----------------------------------------- 11:35 AM on 01/12/2018 -----------------------------------------  The patient continues to remain hemodynamically stable and afebrile.  She is feeling significantly better at this time and is able to tolerate liquids without difficulty.  Her nausea has completely resolved.  Her results are positive for vaginal candidiasis.  Her repeat urinalysis does not have any bacteria, but it does have leukocyte esterase so I have sent it for  culture.  I have spoken with the patient's OB/GYN at [facility] clinic.  This OB/GYN is transferring the patient's care to Dr. Valentino Saxon, as she does not deliver babies.  We have reviewed the patient's workup, and she agrees with no treatment for the leukocyte esterase in the urine, it with follow-up on the culture.  In addition, she has recommended Terconazole for the patient's intravaginal yeast infection.  The patient already has a prescription for Zofran, and I will additionally give her a prescription for vitamin B6.  I have talked to the patient about the abnormal finding on her left kidney, which will need repeat imaging.  Otherwise,  her ultrasound shows an IUP dated at 9 weeks 0 days with a fetal heart rate of 185.  At this time, the patient will be discharged home.  She understands follow-up instructions as well as return precautions.  ____________________________________________  FINAL CLINICAL IMPRESSION(S) / ED DIAGNOSES  Final diagnoses:  Left kidney mass  Yeast infection of the vagina  Hyperemesis gravidarum  Marijuana use         NEW MEDICATIONS STARTED DURING THIS VISIT:  New Prescriptions   MICONAZOLE (MICONAZOLE 7) 100 MG VAGINAL SUPPOSITORY    Place 1 suppository (100 mg total) vaginally at bedtime.   PYRIDOXINE (VITAMIN B-6) 50 MG TABLET    Take 1 tablet (50 mg total) by mouth every 8 (eight) hours as needed (nausea/vomiting).   TERCONAZOLE (TERAZOL 3) 80 MG VAGINAL SUPPOSITORY    Place 1 suppository (80 mg total) vaginally at bedtime.      Rockne MenghiniNorman, Anne-Caroline, MD 01/12/18 1136

## 2018-01-14 LAB — URINE CULTURE

## 2018-01-15 NOTE — ED Provider Notes (Signed)
,  b ----------------------------------------- 2:56 PM on 01/15/2018 -----------------------------------------  I was able to call Ms. Deridder, I was somewhat concerned by the questionable finding of a kidney mass on a transvaginal ultrasound.  I went back and reviewed the films I do not see a kidney mass myself I called radiology this is apparently a transcriptional error, they will make an addendum.  Patient does not have any evidence of a kidney mass she does have a dermoid cyst most likely on the ovary, patient was made aware of these findings she is relieved.  We are also calling in prescriptions for antibiotics for her urinary tract infection, she has no other complaints at this time and she understands the absolute need to follow-up with Dr. Valentino Saxonherry.   Jeanmarie PlantMcShane, Brinley Rosete A, MD 01/15/18 1500

## 2018-01-15 NOTE — Progress Notes (Signed)
ED Culture Report Follow up  Spoke with Dr Alphonzo LemmingsMcShane about UCx with >100k Kleb pneumo. Recommendations for cephalexin 500 mg PO TID x7 days (#21 no refills) approved. MD spoke with patient.  This Clinical research associatewriter also spoke with patient who confirmed no known medication allergies. Stated she uses [facility] [address] [place]. Pt denied having any sxs including no fever, no chills, no pain or burning upon urination. Discussed starting antibiotic, antibiotic directions, and that she needed to follow up with her OB/GYN. Pt stated she is going to reach out to Dr Cherry's office if she does not receive a call by noon tomorrow. This Clinical research associatewriter also informed pt to return to the ED if she does not feel well to get re-evaluated. Rx (cephalexin 500 mg PO TID x7 days #21 no refills) called into [facility] and left on prescriber voicemail line (pharmacy staff instructed writer to leave message on voicemail).

## 2018-01-16 ENCOUNTER — Telehealth: Payer: Self-pay | Admitting: *Deleted

## 2018-01-18 ENCOUNTER — Ambulatory Visit: Payer: 59 | Admitting: Obstetrics and Gynecology

## 2018-01-18 ENCOUNTER — Encounter: Payer: Self-pay | Admitting: Obstetrics and Gynecology

## 2018-01-18 VITALS — BP 137/95 | HR 73 | Ht 64.0 in | Wt 130.2 lb

## 2018-01-18 DIAGNOSIS — N83202 Unspecified ovarian cyst, left side: Secondary | ICD-10-CM | POA: Diagnosis not present

## 2018-01-18 DIAGNOSIS — O219 Vomiting of pregnancy, unspecified: Secondary | ICD-10-CM | POA: Diagnosis not present

## 2018-01-18 DIAGNOSIS — O2341 Unspecified infection of urinary tract in pregnancy, first trimester: Secondary | ICD-10-CM

## 2018-01-18 DIAGNOSIS — Z3A09 9 weeks gestation of pregnancy: Secondary | ICD-10-CM

## 2018-01-18 MED ORDER — DOXYLAMINE-PYRIDOXINE ER 20-20 MG PO TBCR: 1.0000 | EXTENDED_RELEASE_TABLET | Freq: Two times a day (BID) | ORAL | 3 refills | Status: DC

## 2018-01-18 NOTE — Progress Notes (Signed)
GYNECOLOGY PROGRESS NOTE  Subjective:    Patient ID: Teresa Bond, female    DOB: 24-Aug-1986, 32 y.o.   MRN: [id number]  HPI  Patient is a 32 y.o. [id number] female who presents for follow up after Emergency Room visit. She was seen last week in the ER due to complaints of nausea/vomiting, abdominal pain, vaginal discharge with known early pregnancy with unsure dates.  She denied vaginal bleeding or cramping.  Ultrasound in the ER performed was noted to have an SIUP at [redacted] weeks gestation, with EDD of 08/17/2018. Her vaginal discharge was noted to be physiologic based on negative culture, and she was diagnosed with a UTI (Klebsiella).  Patient notes that she has been on the antibiotics for ~ 4 days (was prescribed Keflex). She was also noted to have a left ovarian cyst which patient was unaware of.  Notes that due to her family history of cancer, she was very concerned regarding this.    OB History  Gravida Para Term Preterm AB Living  5 2     2     SAB TAB Ectopic Multiple Live Births               # Outcome Date GA Lbr Len/2nd Weight Sex Delivery Anes PTL Lv  5 Current           4 AB           3 AB           2 Para           1 Para               History reviewed. No pertinent past medical history.    Family History  Problem Relation Age of Onset  . Cancer Maternal Grandmother        ovarian  . Cancer Paternal Grandmother        breast   Past Surgical History:  Procedure Laterality Date  . CESAREAN SECTION      Social History   Socioeconomic History  . Marital status: Single    Spouse name: Not on file  . Number of children: Not on file  . Years of education: Not on file  . Highest education level: Not on file  Social Needs  . Financial resource strain: Not on file  . Food insecurity - worry: Not on file  . Food insecurity - inability: Not on file  . Transportation needs - medical: Not on file  . Transportation needs - non-medical: Not on file  Occupational History    . Not on file  Tobacco Use  . Smoking status: Current Every Day Smoker    Packs/day: 0.50    Types: Cigarettes  . Smokeless tobacco: Never Used  Substance and Sexual Activity  . Alcohol use: No    Frequency: Never  . Drug use: Yes    Types: Marijuana  . Sexual activity: Yes  Other Topics Concern  . Not on file  Social History Narrative  . Not on file    Current Outpatient Medications on File Prior to Visit  Medication Sig Dispense Refill  . cephALEXin (KEFLEX) 500 MG capsule Take 500 mg by mouth 3 (three) times daily.    . miconazole (MICONAZOLE 7) 100 MG vaginal suppository Place 1 suppository (100 mg total) vaginally at bedtime. (Patient not taking: Reported on 01/18/2018) 7 suppository 0  . pyridOXINE (VITAMIN B-6) 50 MG tablet Take 1 tablet (50 mg total) by mouth every  8 (eight) hours as needed (nausea/vomiting). (Patient not taking: Reported on 01/18/2018) 20 tablet 0  . terconazole (TERAZOL 3) 80 MG vaginal suppository Place 1 suppository (80 mg total) vaginally at bedtime. (Patient not taking: Reported on 01/18/2018) 7 suppository 0   No current facility-administered medications on file prior to visit.      No Known Allergies   Review of Systems A comprehensive review of systems was negative except for: Genitourinary: positive for small "white pimple" above clitoris, denies itching or burning but does note vague irritation of area and what is noted in HPI   Objective:   Blood pressure (!) 137/95, pulse 73, height 5\' 4"  (1.626 m), weight 130 lb 3.2 oz (59.1 kg), last menstrual period 11/09/2017. General appearance: alert and no distress Abdomen: soft, non-tender; bowel sounds normal; no masses,  no organomegaly. Well healed Pfannenstiel scar.  Pelvic: external genitalia normal, rectovaginal septum normal. Clitoral hood with very small abrasion, no lesions noted.  Vagina with scant thin white discharge, no odor.  Cervix normal appearing, no lesions and no motion tenderness.   Uterus mobile, nontender, normal shape and size.  Adnexae non-palpable, nontender bilaterally.  Extremities: extremities normal, atraumatic, no cyanosis or edema Neurologic: Grossly normal   Imaging:  ADDENDUM REPORT: 01/16/2018 16:05  ADDENDUM: Typographical error within the impression. The 1st sentence of the 2nd impression should read  Complex exophytic lesion off the left ovary with cystic and solid components.   Electronically Signed   By: Charlett Nose M.D.   On: 01/16/2018 16:05   Addended by Charlett Nose, MD on 01/16/2018 4:08 PM    Study Result   CLINICAL DATA:  Abdominal pain  EXAM: OBSTETRIC <14 WK [place] AND TRANSVAGINAL OB US  TECHNIQUE: Both transabdominal and transvaginal ultrasound examinations were performed for complete evaluation of the gestation as well as the maternal uterus, adnexal regions, and pelvic cul-de-sac. Transvaginal technique was performed to assess early pregnancy.  COMPARISON:  None.  FINDINGS: Intrauterine gestational sac: Single  Yolk sac:  Visualized  Embryo:  Visualized  Cardiac Activity: Visualized  Heart Rate: 185 bpm  MSD:   mm    w     d  CRL:  23.5 mm   9 w   0 d                  [place] EDC: 08/17/2018  Subchorionic hemorrhage:  None visualized.  Maternal uterus/adnexae: Complex exophytic mass off the left ovary measures 3.6 x 2.4 x 2.8 cm. This contains cystic components and solid components, some which are echogenic. This could represent hemorrhagic cyst or dermoid.  IMPRESSION: Nine week intrauterine pregnancy. Fetal heart rate 185 beats per minute.  Complex exophytic lesion off the left kidney with cystic and solid components. This could represent a hemorrhagic cyst or dermoid cyst. This could be followed with repeat ultrasound after pregnancy.       Labs:  Results for orders placed or performed during the hospital encounter of 01/12/18  Chlamydia/NGC rt PCR  Result Value Ref Range    Specimen source GC/Chlam ENDOCERVICAL    Chlamydia Tr NOT DETECTED NOT DETECTED   N gonorrhoeae NOT DETECTED NOT DETECTED  Wet prep, genital  Result Value Ref Range   Yeast Wet Prep HPF POC PRESENT (A) NONE SEEN   Trich, Wet Prep NONE SEEN NONE SEEN   Clue Cells Wet Prep HPF POC NONE SEEN NONE SEEN   WBC, Wet Prep HPF POC FEW (A) NONE SEEN   Sperm NONE  SEEN   Urine culture  Result Value Ref Range   Specimen Description      URINE, RANDOM Performed at [facility], [address] [place], [place] [address]    Special Requests      NONE Performed at [facility], [address] [place], [place] [address]    Culture >=100,000 COLONIES/mL KLEBSIELLA PNEUMONIAE (A)    Report Status 01/14/2018 FINAL    Organism ID, Bacteria KLEBSIELLA PNEUMONIAE (A)       Susceptibility   Klebsiella pneumoniae - MIC*    AMPICILLIN RESISTANT Resistant     CEFAZOLIN <=4 SENSITIVE Sensitive     CEFTRIAXONE <=1 SENSITIVE Sensitive     CIPROFLOXACIN <=0.25 SENSITIVE Sensitive     GENTAMICIN <=1 SENSITIVE Sensitive     IMIPENEM <=0.25 SENSITIVE Sensitive     NITROFURANTOIN <=16 SENSITIVE Sensitive     TRIMETH/SULFA <=20 SENSITIVE Sensitive     AMPICILLIN/SULBACTAM 4 SENSITIVE Sensitive     PIP/TAZO <=4 SENSITIVE Sensitive     Extended ESBL NEGATIVE Sensitive     * >=100,000 COLONIES/mL KLEBSIELLA PNEUMONIAE  Urinalysis, Complete w Microscopic  Result Value Ref Range   Color, Urine AMBER (A) YELLOW   APPearance CLOUDY (A) CLEAR   Specific Gravity, Urine 1.019 1.005 - 1.030   pH 6.0 5.0 - 8.0   Glucose, UA NEGATIVE NEGATIVE mg/dL   Hgb urine dipstick SMALL (A) NEGATIVE   Bilirubin Urine NEGATIVE NEGATIVE   Ketones, ur 5 (A) NEGATIVE mg/dL   Protein, ur 30 (A) NEGATIVE mg/dL   Nitrite NEGATIVE NEGATIVE   Leukocytes, UA MODERATE (A) NEGATIVE   RBC / HPF 6-30 0 - 5 RBC/hpf   WBC, UA 6-30 0 - 5 WBC/hpf   Bacteria, UA RARE (A) NONE SEEN   Squamous Epithelial / LPF TOO NUMEROUS  TO COUNT (A) NONE SEEN   Mucus PRESENT    Budding Yeast PRESENT    Hyphae Yeast PRESENT   hCG, quantitative, pregnancy  Result Value Ref Range   hCG, Beta Chain, Quant, S 261,744 (H) <5 mIU/mL  Lipase, blood  Result Value Ref Range   Lipase 19 11 - 51 U/L  Comprehensive metabolic panel  Result Value Ref Range   Sodium 135 135 - 145 mmol/L   Potassium 3.7 3.5 - 5.1 mmol/L   Chloride 107 101 - 111 mmol/L   CO2 21 (L) 22 - 32 mmol/L   Glucose, Bld 92 65 - 99 mg/dL   BUN 9 6 - 20 mg/dL   Creatinine, Ser 2.950.67 0.44 - 1.00 mg/dL   Calcium 9.3 8.9 - 62.110.3 mg/dL   Total Protein 7.0 6.5 - 8.1 g/dL   Albumin 4.0 3.5 - 5.0 g/dL   AST 18 15 - 41 U/L   ALT 8 (L) 14 - 54 U/L   Alkaline Phosphatase 43 38 - 126 U/L   Total Bilirubin 0.7 0.3 - 1.2 mg/dL   GFR calc non Af Amer >60 >60 mL/min   GFR calc Af Amer >60 >60 mL/min   Anion gap 7 5 - 15  CBC  Result Value Ref Range   WBC 7.4 3.6 - 11.0 K/uL   RBC 4.42 3.80 - 5.20 MIL/uL   Hemoglobin 13.5 12.0 - 16.0 g/dL   HCT 30.838.9 65.735.0 - 84.647.0 %   MCV 87.9 80.0 - 100.0 fL   MCH 30.5 26.0 - 34.0 pg   MCHC 34.7 32.0 - 36.0 g/dL   RDW 96.213.6 95.211.5 - 84.114.5 %   Platelets  230 150 - 440 K/uL  Urinalysis, Complete w Microscopic  Result Value Ref Range   Color, Urine YELLOW (A) YELLOW   APPearance HAZY (A) CLEAR   Specific Gravity, Urine 1.016 1.005 - 1.030   pH 6.0 5.0 - 8.0   Glucose, UA NEGATIVE NEGATIVE mg/dL   Hgb urine dipstick NEGATIVE NEGATIVE   Bilirubin Urine NEGATIVE NEGATIVE   Ketones, ur 20 (A) NEGATIVE mg/dL   Protein, ur NEGATIVE NEGATIVE mg/dL   Nitrite NEGATIVE NEGATIVE   Leukocytes, UA TRACE (A) NEGATIVE   RBC / HPF 0-5 0 - 5 RBC/hpf   WBC, UA 0-5 0 - 5 WBC/hpf   Bacteria, UA NONE SEEN NONE SEEN   Squamous Epithelial / LPF 0-5 (A) NONE SEEN   Mucus PRESENT   Pregnancy, urine POC  Result Value Ref Range   Preg Test, Ur POSITIVE (A) NEGATIVE  ABO/Rh  Result Value Ref Range   ABO/RH(D)      A POS Performed at [facility], [address] [place], [place] [address]     Assessment:    Pregnancy at [redacted] weeks gestation Klebsiella UTI Left ovarian cyst Nausea/vomiting  Plan:   1. Pregnancy at [redacted] weeks gestation - Will establish [facility] with [facility].  Is a patient at [facility], which is a GYN only clinic. Has NOB intake appointment later this week. Encouraged to begin a PNV. Ultrasound confirmed viability last week in ER.   2. Klebsiella UTI - To continue Keflex course.  Will need repeat culture in 3-4 weeks to ensure resolution.   3. Left ovarian cyst - Discussed left ovarian cyst finding (dermoid vs hemorrhagic cyst). Reassured patient that current cyst has a very low probability of being cancerous. Does not require intervention at this time. Will monitor during the pregnancy at each scheduled ultrasound.  Can discuss further genetic cancer screening options after pregnancy.   4. Nausea/vomiting - Patient notes that she has tried taking Vitamin B6 which makes her vomiting worse. Was given a prescription of Zofran by her GYN but has been afraid to take it due to risks to the pregnancy. Advised that Zofran was ok to take in short periods of time. Will also prescribe Bonjesta.     Hildred Laser, MD [facility]

## 2018-01-20 ENCOUNTER — Ambulatory Visit: Payer: 59 | Admitting: Certified Nurse Midwife

## 2018-01-20 VITALS — BP 113/66 | HR 71 | Wt 132.2 lb

## 2018-01-20 DIAGNOSIS — Z3A09 9 weeks gestation of pregnancy: Secondary | ICD-10-CM

## 2018-01-20 LAB — OB RESULTS CONSOLE VARICELLA ZOSTER ANTIBODY, IGG: VARICELLA IGG: NON-IMMUNE/NOT IMMUNE

## 2018-01-20 NOTE — Progress Notes (Signed)
Neaveh R Lebow presents for NOB nurse interview visit. [facility]__.  G- 5.  P-  2  . Pregnancy education material explained and given. _0__ cats in the home. NOB labs ordered.  Marland Kitchen. HIV labs and Drug screen were explained optional and she did not decline. Drug screen ordered. PNV encouraged. Genetic screening options discussed. Genetic testing: Ordered.  Pt may discuss with provider. Pt. To follow up with provider in _2_ weeks for NOB physical.  All questions answered.

## 2018-01-22 LAB — HEPATITIS B SURFACE ANTIGEN: Hepatitis B Surface Ag: NEGATIVE

## 2018-01-22 LAB — GC/CHLAMYDIA PROBE AMP
CHLAMYDIA, DNA PROBE: NEGATIVE
Neisseria gonorrhoeae by PCR: NEGATIVE

## 2018-01-22 LAB — RUBELLA SCREEN

## 2018-01-22 LAB — HIV ANTIBODY (ROUTINE TESTING W REFLEX): HIV SCREEN 4TH GENERATION: NONREACTIVE

## 2018-01-22 LAB — VARICELLA ZOSTER ANTIBODY, IGG: Varicella zoster IgG: 669 index (ref >165)

## 2018-01-22 LAB — RPR: RPR: NONREACTIVE

## 2018-01-25 ENCOUNTER — Encounter: Payer: Self-pay | Admitting: Obstetrics and Gynecology

## 2018-01-25 ENCOUNTER — Other Ambulatory Visit: Payer: Self-pay

## 2018-01-25 LAB — MATERNIT 21 PLUS CORE, BLOOD
CHROMOSOME 18: NEGATIVE
Chromosome 13: NEGATIVE
Chromosome 21: NEGATIVE
Y Chromosome: NOT DETECTED

## 2018-01-25 MED ORDER — VITAFOL GUMMIES 3.33-0.333-34.8 MG PO CHEW: 34.8000 mg | CHEWABLE_TABLET | Freq: Every day | ORAL | 3 refills | Status: DC

## 2018-01-26 ENCOUNTER — Encounter: Payer: Self-pay | Admitting: Obstetrics and Gynecology

## 2018-01-26 LAB — NICOTINE SCREEN, URINE: Cotinine Ql Scrn, Ur: POSITIVE ng/mL — AB

## 2018-01-26 LAB — MONITOR DRUG PROFILE 14(MW)
AMPHETAMINE SCREEN URINE: NEGATIVE ng/mL
BARBITURATE SCREEN URINE: NEGATIVE ng/mL
BENZODIAZEPINE SCREEN, URINE: NEGATIVE ng/mL
BUPRENORPHINE, URINE: NEGATIVE ng/mL
COCAINE(METAB.)SCREEN, URINE: NEGATIVE ng/mL
Creatinine(Crt), U: 115 mg/dL (ref 20.0–300.0)
FENTANYL, URINE: NEGATIVE pg/mL
MEPERIDINE SCREEN, URINE: NEGATIVE ng/mL
Methadone Screen, Urine: NEGATIVE ng/mL
OXYCODONE+OXYMORPHONE UR QL SCN: NEGATIVE ng/mL
Opiate Scrn, Ur: NEGATIVE ng/mL
PH UR, DRUG SCRN: 5.8 (ref 4.5–8.9)
PROPOXYPHENE SCREEN URINE: NEGATIVE ng/mL
Phencyclidine Qn, Ur: NEGATIVE ng/mL
SPECIFIC GRAVITY: 1.024
Tramadol Screen, Urine: NEGATIVE ng/mL

## 2018-01-26 LAB — CANNABINOID (GC/MS), URINE
CANNABINOID UR: POSITIVE — AB
Carboxy THC (GC/MS): >300 ng/mL

## 2018-01-31 ENCOUNTER — Encounter: Payer: Self-pay | Admitting: Obstetrics and Gynecology

## 2018-02-06 ENCOUNTER — Ambulatory Visit (INDEPENDENT_AMBULATORY_CARE_PROVIDER_SITE_OTHER): Payer: 59 | Admitting: Certified Nurse Midwife

## 2018-02-06 VITALS — BP 106/68 | HR 80 | Wt 129.0 lb

## 2018-02-06 DIAGNOSIS — Z2839 Other underimmunization status: Secondary | ICD-10-CM

## 2018-02-06 DIAGNOSIS — F1290 Cannabis use, unspecified, uncomplicated: Secondary | ICD-10-CM

## 2018-02-06 DIAGNOSIS — Z3492 Encounter for supervision of normal pregnancy, unspecified, second trimester: Secondary | ICD-10-CM | POA: Diagnosis not present

## 2018-02-06 DIAGNOSIS — Z98891 History of uterine scar from previous surgery: Secondary | ICD-10-CM | POA: Insufficient documentation

## 2018-02-06 DIAGNOSIS — F17200 Nicotine dependence, unspecified, uncomplicated: Secondary | ICD-10-CM

## 2018-02-06 DIAGNOSIS — O219 Vomiting of pregnancy, unspecified: Secondary | ICD-10-CM

## 2018-02-06 DIAGNOSIS — O09899 Supervision of other high risk pregnancies, unspecified trimester: Secondary | ICD-10-CM

## 2018-02-06 DIAGNOSIS — O9989 Other specified diseases and conditions complicating pregnancy, childbirth and the puerperium: Secondary | ICD-10-CM

## 2018-02-06 DIAGNOSIS — Z283 Underimmunization status: Secondary | ICD-10-CM

## 2018-02-06 LAB — POCT URINALYSIS DIPSTICK
Bilirubin, UA: NEGATIVE
Blood, UA: NEGATIVE
GLUCOSE UA: NEGATIVE
Leukocytes, UA: NEGATIVE
NITRITE UA: NEGATIVE
PROTEIN UA: NEGATIVE
SPEC GRAV UA: 1.015 (ref 1.010–1.025)
Urobilinogen, UA: 0.2 E.U./dL
pH, UA: 6.5 (ref 5.0–8.0)

## 2018-02-06 MED ORDER — ONDANSETRON 4 MG PO TBDP: 4.0000 mg | ORAL_TABLET | Freq: Four times a day (QID) | ORAL | 0 refills | Status: DC | PRN

## 2018-02-06 NOTE — Progress Notes (Signed)
NEW OB HISTORY AND PHYSICAL  SUBJECTIVE:       Teresa Bond is a 32 y.o. [phone] female, Patient's last menstrual period was 11/09/2017., Estimated Date of Delivery: 08/17/18, 6123w4d, presents today for establishment of Prenatal Care.  She has no unusual complaints. Reports daily nausea without vomiting.   Denies difficulty breathing or respiratory distress, chest pain, abdominal pain, vaginal bleeding, dysuria, ane leg pain or swelling.   Gynecologic History  Patient's last menstrual period was 11/09/2017.   Contraception: none   Last Pap: 11/2017. Results were: normal  Obstetric History OB History  Gravida Para Term Preterm AB Living  5 2 2   2 2   SAB TAB Ectopic Multiple Live Births  2       2    # Outcome Date GA Lbr Len/2nd Weight Sex Delivery Anes PTL Lv  5 Current           4 SAB 2016          3 SAB 2016          2 Term 2015    M CS-Unspec   LIV  1 Term 2011    F CS-Unspec   LIV      No past medical history on file.  Past Surgical History:  Procedure Laterality Date  . CESAREAN SECTION      Current Outpatient Medications on File Prior to Visit  Medication Sig Dispense Refill  . cephALEXin (KEFLEX) 500 MG capsule Take 500 mg by mouth 3 (three) times daily.    . Doxylamine-Pyridoxine ER (BONJESTA) 20-20 MG TBCR Take 1 tablet by mouth 2 (two) times daily. (Patient not taking: Reported on 01/20/2018) 60 tablet 3  . Prenatal Vit-Fe Phos-FA-Omega (VITAFOL GUMMIES) 3.33-0.333-34.8 MG CHEW Chew 34.8 mg by mouth daily. (Patient not taking: Reported on 02/06/2018) 90 tablet 3  . pyridOXINE (VITAMIN B-6) 50 MG tablet Take 1 tablet (50 mg total) by mouth every 8 (eight) hours as needed (nausea/vomiting). (Patient not taking: Reported on 02/06/2018) 20 tablet 0   No current facility-administered medications on file prior to visit.     No Known Allergies  Social History   Socioeconomic History  . Marital status: Single    Spouse name: Not on file  . Number of children:  Not on file  . Years of education: Not on file  . Highest education level: Not on file  Social Needs  . Financial resource strain: Not on file  . Food insecurity - worry: Not on file  . Food insecurity - inability: Not on file  . Transportation needs - medical: Not on file  . Transportation needs - non-medical: Not on file  Occupational History  . Not on file  Tobacco Use  . Smoking status: Current Every Day Smoker    Packs/day: 0.50    Types: Cigarettes  . Smokeless tobacco: Never Used  Substance and Sexual Activity  . Alcohol use: No    Frequency: Never  . Drug use: Yes    Types: Marijuana  . Sexual activity: Yes  Other Topics Concern  . Not on file  Social History Narrative  . Not on file    Family History  Problem Relation Age of Onset  . Cancer Maternal Grandmother        ovarian  . Cancer Paternal Grandmother        breast  . COPD Mother     The following portions of the patient's history were reviewed and updated as appropriate: allergies,  current medications, past OB history, past medical history, past surgical history, past family history, past social history, and problem list.    OBJECTIVE:  BP 106/68   Pulse 80   Wt 129 lb (58.5 kg)   LMP 11/09/2017   BMI 22.14 kg/m   Initial Physical Exam (New OB)  GENERAL APPEARANCE: alert, well appearing, in no apparent distress  HEAD: normocephalic, atraumatic  MOUTH: mucous membranes moist, pharynx normal without lesions  THYROID: no thyromegaly or masses present  BREASTS: patient declined exam  LUNGS: clear to auscultation, no wheezes, rales or rhonchi, symmetric air entry  HEART: regular rate and rhythm, no murmurs  ABDOMEN: soft, nontender, nondistended, no abnormal masses, no epigastric pain, fundus not palpable and FHT present  EXTREMITIES: no redness or tenderness in the calves or thighs, no edema  SKIN: normal coloration and turgor, no rashes  LYMPH NODES: no adenopathy  palpable  NEUROLOGIC: alert, oriented, normal speech, no focal findings or movement disorder noted  PELVIC EXAM: patient declined exam  ASSESSMENT: Normal pregnancy Marijuana use Previous c-section x 2, desires repeat Nausea and vomiting in pregnancy Rubella non-immune Smoker  PLAN: Prenatal care New OB counseling: The patient has been given an overview regarding routine prenatal care. Recommendations regarding diet, weight gain, and exercise in pregnancy were given. Prenatal testing, optional genetic testing, and ultrasound use in pregnancy were reviewed.  Benefits of Breast Feeding were discussed. The patient is encouraged to consider nursing her baby post partum. See orders   Gunnar Bulla, CNM [facility], [facility]

## 2018-02-06 NOTE — Patient Instructions (Signed)
Ondansetron tablets What is this medicine? ONDANSETRON (on DAN se tron) is used to treat nausea and vomiting caused by chemotherapy. It is also used to prevent or treat nausea and vomiting after surgery. This medicine may be used for other purposes; ask your health care provider or pharmacist if you have questions. COMMON BRAND NAME(S): Zofran What should I tell my health care provider before I take this medicine? They need to know if you have any of these conditions: -heart disease -history of irregular heartbeat -liver disease -low levels of magnesium or potassium in the blood -an unusual or allergic reaction to ondansetron, granisetron, other medicines, foods, dyes, or preservatives -pregnant or trying to get pregnant -breast-feeding How should I use this medicine? Take this medicine by mouth with a glass of water. Follow the directions on your prescription label. Take your doses at regular intervals. Do not take your medicine more often than directed. Talk to your pediatrician regarding the use of this medicine in children. Special care may be needed. Overdosage: If you think you have taken too much of this medicine contact a poison control center or emergency room at once. NOTE: This medicine is only for you. Do not share this medicine with others. What if I miss a dose? If you miss a dose, take it as soon as you can. If it is almost time for your next dose, take only that dose. Do not take double or extra doses. What may interact with this medicine? Do not take this medicine with any of the following medications: -apomorphine -certain medicines for fungal infections like fluconazole, itraconazole, ketoconazole, posaconazole, voriconazole -cisapride -dofetilide -dronedarone -pimozide -thioridazine -ziprasidone This medicine may also interact with the following medications: -carbamazepine -certain medicines for depression, anxiety, or psychotic  disturbances -fentanyl -linezolid -MAOIs like Carbex, Eldepryl, Marplan, Nardil, and Parnate -methylene blue (injected into a vein) -other medicines that prolong the QT interval (cause an abnormal heart rhythm) -phenytoin -rifampicin -tramadol This list may not describe all possible interactions. Give your health care provider a list of all the medicines, herbs, non-prescription drugs, or dietary supplements you use. Also tell them if you smoke, drink alcohol, or use illegal drugs. Some items may interact with your medicine. What should I watch for while using this medicine? Check with your doctor or health care professional right away if you have any sign of an allergic reaction. What side effects may I notice from receiving this medicine? Side effects that you should report to your doctor or health care professional as soon as possible: -allergic reactions like skin rash, itching or hives, swelling of the face, lips or tongue -breathing problems -confusion -dizziness -fast or irregular heartbeat -feeling faint or lightheaded, falls -fever and chills -loss of balance or coordination -seizures -sweating -swelling of the hands or feet -tightness in the chest -tremors -unusually weak or tired Side effects that usually do not require medical attention (report to your doctor or health care professional if they continue or are bothersome): -constipation or diarrhea -headache This list may not describe all possible side effects. Call your doctor for medical advice about side effects. You may report side effects to FDA at 1-800-FDA-1088. Where should I keep my medicine? Keep out of the reach of children. Store between 2 and 30 degrees C (36 and 86 degrees F). Throw away any unused medicine after the expiration date. NOTE: This sheet is a summary. It may not cover all possible information. If you have questions about this medicine, talk to your doctor, pharmacist,  or health care  provider.  2018 Elsevier/Gold Standard (2013-09-12 16:27:45) Abdominal Pain During Pregnancy Belly (abdominal) pain is common during pregnancy. Most of the time, it is not a serious problem. Other times, it can be a sign that something is wrong with the pregnancy. Always tell your doctor if you have belly pain. Follow these instructions at home: Monitor your belly pain for any changes. The following actions may help you feel better:  Do not have sex (intercourse) or put anything in your vagina until you feel better.  Rest until your pain stops.  Drink clear fluids if you feel sick to your stomach (nauseous). Do not eat solid food until you feel better.  Only take medicine as told by your doctor.  Keep all doctor visits as told.  Get help right away if:  You are bleeding, leaking fluid, or pieces of tissue come out of your vagina.  You have more pain or cramping.  You keep throwing up (vomiting).  You have pain when you pee (urinate) or have blood in your pee.  You have a fever.  You do not feel your baby moving as much.  You feel very weak or feel like passing out.  You have trouble breathing, with or without belly pain.  You have a very bad headache and belly pain.  You have fluid leaking from your vagina and belly pain.  You keep having watery poop (diarrhea).  Your belly pain does not go away after resting, or the pain gets worse. This information is not intended to replace advice given to you by your health care provider. Make sure you discuss any questions you have with your health care provider. Document Released: 11/24/2009 Document Revised: 07/14/2016 Document Reviewed: 07/05/2013 Elsevier Interactive Patient Education  2018 [place]. Back Pain in Pregnancy Back pain during pregnancy is common. Back pain may be caused by several factors that are related to changes during your pregnancy. Follow these instructions at home: Managing pain, stiffness, and  swelling  If directed, apply ice for sudden (acute) back pain. ? Put ice in a plastic bag. ? Place a towel between your skin and the bag. ? Leave the ice on for 20 minutes, 2-3 times per day.  If directed, apply heat to the affected area before you exercise: ? Place a towel between your skin and the heat pack or heating pad. ? Leave the heat on for 20-30 minutes. ? Remove the heat if your skin turns bright red. This is especially important if you are unable to feel pain, heat, or cold. You may have a greater risk of getting burned. Activity  Exercise as told by your health care provider. Exercising is the best way to prevent or manage back pain.  Listen to your body when lifting. If lifting hurts, ask for help or bend your knees. This uses your leg muscles instead of your back muscles.  Squat down when picking up something from the floor. Do not bend over.  Only use bed rest as told by your health care provider. Bed rest should only be used for the most severe episodes of back pain. Standing, Sitting, and Lying Down  Do not stand in one place for long periods of time.  Use good posture when sitting. Make sure your head rests over your shoulders and is not hanging forward. Use a pillow on your lower back if necessary.  Try sleeping on your side, preferably the left side, with a pillow or two between your legs.  If you are sore after a night's rest, your bed may be too soft. A firm mattress may provide more support for your back during pregnancy. General instructions  Do not wear high heels.  Eat a healthy diet. Try to gain weight within your health care provider's recommendations.  Use a maternity girdle, elastic sling, or back brace as told by your health care provider.  Take over-the-counter and prescription medicines only as told by your health care provider.  Keep all follow-up visits as told by your health care provider. This is important. This includes any visits with any  specialists, such as a physical therapist. Contact a health care provider if:  Your back pain interferes with your daily activities.  You have increasing pain in other parts of your body. Get help right away if:  You develop numbness, tingling, weakness, or problems with the use of your arms or legs.  You develop severe back pain that is not controlled with medicine.  You have a sudden change in bowel or bladder control.  You develop shortness of breath, dizziness, or you faint.  You develop nausea, vomiting, or sweating.  You have back pain that is a rhythmic, cramping pain similar to labor pains. Labor pain is usually 1-2 minutes apart, lasts for about 1 minute, and involves a bearing down feeling or pressure in your pelvis.  You have back pain and your water breaks or you have vaginal bleeding.  You have back pain or numbness that travels down your leg.  Your back pain developed after you fell.  You develop pain on one side of your back.  You see blood in your urine.  You develop skin blisters in the area of your back pain. This information is not intended to replace advice given to you by your health care provider. Make sure you discuss any questions you have with your health care provider. Document Released: 03/16/2006 Document Revised: 05/13/2016 Document Reviewed: 08/20/2015 Elsevier Interactive Patient Education  2018 [place] of Pregnancy The second trimester is from week 13 through week 28, month 4 through 6. This is often the time in pregnancy that you feel your best. Often times, morning sickness has lessened or quit. You may have more energy, and you may get hungry more often. Your unborn baby (fetus) is growing rapidly. At the end of the sixth month, he or she is about 9 inches long and weighs about 1 pounds. You will likely feel the baby move (quickening) between 18 and 20 weeks of pregnancy. Follow these instructions at home:  Avoid all  smoking, herbs, and alcohol. Avoid drugs not approved by your doctor.  Do not use any tobacco products, including cigarettes, chewing tobacco, and electronic cigarettes. If you need help quitting, ask your doctor. You may get counseling or other support to help you quit.  Only take medicine as told by your doctor. Some medicines are safe and some are not during pregnancy.  Exercise only as told by your doctor. Stop exercising if you start having cramps.  Eat regular, healthy meals.  Wear a good support bra if your breasts are tender.  Do not use hot tubs, steam rooms, or saunas.  Wear your seat belt when driving.  Avoid raw meat, uncooked cheese, and liter boxes and soil used by cats.  Take your prenatal vitamins.  Take 1500-2000 milligrams of calcium daily starting at the 20th week of pregnancy until you deliver your baby.  Try taking medicine that helps you poop (  stool softener) as needed, and if your doctor approves. Eat more fiber by eating fresh fruit, vegetables, and whole grains. Drink enough fluids to keep your pee (urine) clear or pale yellow.  Take warm water baths (sitz baths) to soothe pain or discomfort caused by hemorrhoids. Use hemorrhoid cream if your doctor approves.  If you have puffy, bulging veins (varicose veins), wear support hose. Raise (elevate) your feet for 15 minutes, 3-4 times a day. Limit salt in your diet.  Avoid heavy lifting, wear low heals, and sit up straight.  Rest with your legs raised if you have leg cramps or low back pain.  Visit your dentist if you have not gone during your pregnancy. Use a soft toothbrush to brush your teeth. Be gentle when you floss.  You can have sex (intercourse) unless your doctor tells you not to.  Go to your doctor visits. Get help if:  You feel dizzy.  You have mild cramps or pressure in your lower belly (abdomen).  You have a nagging pain in your belly area.  You continue to feel sick to your stomach  (nauseous), throw up (vomit), or have watery poop (diarrhea).  You have bad smelling fluid coming from your vagina.  You have pain with peeing (urination). Get help right away if:  You have a fever.  You are leaking fluid from your vagina.  You have spotting or bleeding from your vagina.  You have severe belly cramping or pain.  You lose or gain weight rapidly.  You have trouble catching your breath and have chest pain.  You notice sudden or extreme puffiness (swelling) of your face, hands, ankles, feet, or legs.  You have not felt the baby move in over an hour.  You have severe headaches that do not go away with medicine.  You have vision changes. This information is not intended to replace advice given to you by your health care provider. Make sure you discuss any questions you have with your health care provider. Document Released: 03/02/2010 Document Revised: 05/13/2016 Document Reviewed: 02/06/2013 Elsevier Interactive Patient Education  2017 [place] for Pregnant Women While you are pregnant, your body will require additional nutrition to help support your growing baby. It is recommended that you consume:  150 additional calories each day during your first trimester.  300 additional calories each day during your second trimester.  300 additional calories each day during your third trimester.  Eating a healthy, well-balanced diet is very important for your health and for your baby's health. You also have a higher need for some vitamins and minerals, such as folic acid, calcium, iron, and vitamin D. What do I need to know about eating during pregnancy?  Do not try to lose weight or go on a diet during pregnancy.  Choose healthy, nutritious foods. Choose  of a sandwich with a glass of milk instead of a candy bar or a high-calorie sugar-sweetened beverage.  Limit your overall intake of foods that have "empty calories." These are foods that have little  nutritional value, such as sweets, desserts, candies, sugar-sweetened beverages, and fried foods.  Eat a variety of foods, especially fruits and vegetables.  Take a prenatal vitamin to help meet the additional needs during pregnancy, specifically for folic acid, iron, calcium, and vitamin D.  Remember to stay active. Ask your health care provider for exercise recommendations that are specific to you.  Practice good food safety and cleanliness, such as washing your hands before you eat and  after you prepare raw meat. This helps to prevent foodborne illnesses, such as listeriosis, that can be very dangerous for your baby. Ask your health care provider for more information about listeriosis. What does 150 extra calories look like? Healthy options for an additional 150 calories each day could be any of the following:  Plain low-fat yogurt (6-8 oz) with  cup of berries.  1 apple with 2 teaspoons of peanut butter.  Cut-up vegetables with  cup of hummus.  Low-fat chocolate milk (8 oz or 1 cup).  1 string cheese with 1 medium orange.   of a peanut butter and jelly sandwich on whole-wheat bread (1 tsp of peanut butter).  For 300 calories, you could eat two of those healthy options each day. What is a healthy amount of weight to gain? The recommended amount of weight for you to gain is based on your pre-pregnancy BMI. If your pre-pregnancy BMI was:  Less than 18 (underweight), you should gain 28-40 lb.  18-24.9 (normal), you should gain 25-35 lb.  25-29.9 (overweight), you should gain 15-25 lb.  Greater than 30 (obese), you should gain 11-20 lb.  What if I am having twins or multiples? Generally, pregnant women who will be having twins or multiples may need to increase their daily calories by 300-600 calories each day. The recommended range for total weight gain is 25-54 lb, depending on your pre-pregnancy BMI. Talk with your health care provider for specific guidance about additional  nutritional needs, weight gain, and exercise during your pregnancy. What foods can I eat? Grains Any grains. Try to choose whole grains, such as whole-wheat bread, oatmeal, or brown rice. Vegetables Any vegetables. Try to eat a variety of colors and types of vegetables to get a full range of vitamins and minerals. Remember to wash your vegetables well before eating. Fruits Any fruits. Try to eat a variety of colors and types of fruit to get a full range of vitamins and minerals. Remember to wash your fruits well before eating. Meats and Other Protein Sources Lean meats, including chicken, [place], fish, and lean cuts of beef, veal, or pork. Make sure that all meats are cooked to "well done." Tofu. Tempeh. Beans. Eggs. Peanut butter and other nut butters. Seafood, such as shrimp, crab, and lobster. If you choose fish, select types that are higher in omega-3 fatty acids, including salmon, herring, mussels, trout, sardines, and pollock. Make sure that all meats are cooked to food-safe temperatures. Dairy Pasteurized milk and milk alternatives. Pasteurized yogurt and pasteurized cheese. Cottage cheese. Sour cream. Beverages Water. Juices that contain 100% fruit juice or vegetable juice. Caffeine-free teas and decaffeinated coffee. Drinks that contain caffeine are okay to drink, but it is better to avoid caffeine. Keep your total caffeine intake to less than 200 mg each day (12 oz of coffee, tea, or soda) or as directed by your health care provider. Condiments Any pasteurized condiments. Sweets and Desserts Any sweets and desserts. Fats and Oils Any fats and oils. The items listed above may not be a complete list of recommended foods or beverages. Contact your dietitian for more options. What foods are not recommended? Vegetables Unpasteurized (raw) vegetable juices. Fruits Unpasteurized (raw) fruit juices. Meats and Other Protein Sources Cured meats that have nitrates, such as bacon, salami,  and hotdogs. Luncheon meats, bologna, or other deli meats (unless they are reheated until they are steaming hot). Refrigerated pate, meat spreads from a meat counter, smoked seafood that is found in the refrigerated section of  a store. Raw fish, such as sushi or sashimi. High mercury content fish, such as tilefish, shark, swordfish, and king mackerel. Raw meats, such as tuna or beef tartare. Undercooked meats and poultry. Make sure that all meats are cooked to food-safe temperatures. Dairy Unpasteurized (raw) milk and any foods that have raw milk in them. Soft cheeses, such as feta, queso blanco, queso fresco, Brie, Camembert cheeses, blue-veined cheeses, and Panela cheese (unless it is made with pasteurized milk, which must be stated on the label). Beverages Alcohol. Sugar-sweetened beverages, such as sodas, teas, or energy drinks. Condiments Homemade fermented foods and drinks, such as pickles, sauerkraut, or kombucha drinks. (Store-bought pasteurized versions of these are okay.) Other Salads that are made in the store, such as ham salad, chicken salad, egg salad, tuna salad, and seafood salad. The items listed above may not be a complete list of foods and beverages to avoid. Contact your dietitian for more information. This information is not intended to replace advice given to you by your health care provider. Make sure you discuss any questions you have with your health care provider. Document Released: 09/20/2014 Document Revised: 05/13/2016 Document Reviewed: 05/21/2014 Elsevier Interactive Patient Education  2018 [employer]. Common Medications Safe in Pregnancy  Acne:      Constipation:  Benzoyl Peroxide     Colace  Clindamycin      Dulcolax Suppository  [place]ica Erythromycin     Fibercon  Salicylic Acid      Metamucil         Miralax AVOID:        Senakot   Accutane    Cough:  Retin-A       Cough Drops  Tetracycline      Phenergan w/ Codeine if Rx  Minocycline      Robitussin  (Plain & DM)  Antibiotics:     Crabs/Lice:  Ceclor       RID  Cephalosporins    AVOID:  E-Mycins      Kwell  Keflex  Macrobid/Macrodantin   Diarrhea:  Penicillin      Kao-Pectate  Zithromax      Imodium AD         PUSH FLUIDS AVOID:       Cipro     Fever:  Tetracycline      Tylenol (Regular or Extra  Minocycline       Strength)  Levaquin      Extra Strength-Do not          Exceed 8 tabs/24 hrs Caffeine:        '200mg'$ /day (equiv. To 1 cup of coffee or  approx. 3 12 oz sodas)         Gas: Cold/Hayfever:       Gas-X  Benadryl      Mylicon  Claritin       Phazyme  **Claritin-D        Chlor-Trimeton    Headaches:  Dimetapp      ASA-Free Excedrin  Drixoral-Non-Drowsy     Cold Compress  Mucinex (Guaifenasin)     Tylenol (Regular or Extra  Sudafed/Sudafed-12 Hour     Strength)  **Sudafed PE Pseudoephedrine   Tylenol Cold & Sinus     Vicks Vapor Rub  Zyrtec  **AVOID if Problems With Blood Pressure         Heartburn: Avoid lying down for at least 1 hour after meals  Aciphex      Maalox     Rash:  Milk of  Magnesia     Benadryl    Mylanta       1% Hydrocortisone Cream  Pepcid  Pepcid Complete   Sleep Aids:  Prevacid      Ambien   Prilosec       Benadryl  Rolaids       Chamomile Tea  Tums (Limit 4/day)     Unisom  Zantac       Tylenol PM         Warm milk-add vanilla or  Hemorrhoids:       Sugar for taste  Anusol/Anusol H.C.  (RX: Analapram 2.5%)  Sugar Substitutes:  Hydrocortisone OTC     Ok in moderation  Preparation H      Tucks        Vaseline lotion applied to tissue with wiping    Herpes:     Throat:  Acyclovir      Oragel  Famvir  Valtrex     Vaccines:         Flu Shot Leg Cramps:       *Gardasil  Benadryl      Hepatitis A         Hepatitis B Nasal Spray:       Pneumovax  Saline Nasal Spray     Polio Booster         Tetanus Nausea:       Tuberculosis test or PPD  Vitamin B6 25 mg TID   AVOID:    Dramamine      *Gardasil  Emetrol       Live  Poliovirus  Ginger Root 250 mg QID    MMR (measles, mumps &  High Complex Carbs @ Bedtime    rebella)  Sea Bands-Accupressure    Varicella (Chickenpox)  Unisom 1/2 tab TID     *No known complications           If received before Pain:         Known pregnancy;   Darvocet       Resume series after  Lortab        Delivery  Percocet    Yeast:   Tramadol      Femstat  Tylenol 3      Gyne-lotrimin  Ultram       Monistat  Vicodin           MISC:         All Sunscreens           Hair Coloring/highlights          Insect Repellant's          (Including DEET)         Mystic Tans WHAT OB PATIENTS CAN EXPECT   Confirmation of pregnancy and ultrasound ordered if medically indicated-[redacted] weeks gestation  New OB (NOB) intake with nurse and New OB (NOB) labs- [redacted] weeks gestation  New OB (NOB) physical examination with provider- 11/[redacted] weeks gestation  Flu vaccine-[redacted] weeks gestation  Anatomy scan-[redacted] weeks gestation  Glucose tolerance test, blood work to test for anemia, T-dap vaccine-[redacted] weeks gestation  Vaginal swabs/cultures-STD/Group B strep-[redacted] weeks gestation  Appointments every 4 weeks until 28 weeks  Every 2 weeks from 28 weeks until 36 weeks  Weekly visits from 36 weeks until delivery

## 2018-02-06 NOTE — Progress Notes (Signed)
NOB PE- pt is still having nausea, otherwise is doing well

## 2018-02-23 ENCOUNTER — Encounter: Payer: Self-pay | Admitting: Obstetrics and Gynecology

## 2018-03-06 ENCOUNTER — Ambulatory Visit (INDEPENDENT_AMBULATORY_CARE_PROVIDER_SITE_OTHER): Payer: 59 | Admitting: Certified Nurse Midwife

## 2018-03-06 VITALS — BP 104/74 | HR 72 | Wt 132.5 lb

## 2018-03-06 DIAGNOSIS — Z3482 Encounter for supervision of other normal pregnancy, second trimester: Secondary | ICD-10-CM | POA: Diagnosis not present

## 2018-03-06 LAB — POCT URINALYSIS DIPSTICK
BILIRUBIN UA: NEGATIVE
Blood, UA: NEGATIVE
Glucose, UA: NEGATIVE
Ketones, UA: NEGATIVE
Leukocytes, UA: NEGATIVE
Nitrite, UA: NEGATIVE
Protein, UA: NEGATIVE
SPEC GRAV UA: 1.015 (ref 1.010–1.025)
Urobilinogen, UA: 0.2 E.U./dL
pH, UA: 7.5 (ref 5.0–8.0)

## 2018-03-06 NOTE — Progress Notes (Signed)
ROB doing well. Complains of nausea. Discussed self help options. Encouraged use of vitamin B 6 and Unisom. She verbalizes understanding and agrees to plan . Follow up 4 wks.   Doreene BurkeAnnie Armie Moren, CNM

## 2018-03-06 NOTE — Progress Notes (Signed)
ROB-pt is having a lot n/v all day daily. Unable to keep anything down.

## 2018-03-06 NOTE — Patient Instructions (Signed)

## 2018-03-14 ENCOUNTER — Telehealth: Payer: Self-pay | Admitting: Obstetrics and Gynecology

## 2018-03-14 NOTE — Telephone Encounter (Signed)
Pt was called back to get more information on what she needed to have done. No answer left message via voicemail to call the office to get more information.

## 2018-03-14 NOTE — Telephone Encounter (Signed)
Pt called back and went over what she needed to have done. Pt needed her [employer] paperwork refaxed to her employee stating 8 hours instead of 2 hours due to her illness.

## 2018-03-14 NOTE — Telephone Encounter (Signed)
The patient called and stated that she needs a nurse to resubmit her STD paperwork, The patient is currently not able to keep any food down and is constanly throwing up. The patient stated that previously she would need 2 hours out of the office, and that now needs to changed, according to the patient. The patient would like a call back for clarification. Please advise.

## 2018-03-24 ENCOUNTER — Encounter: Payer: Self-pay | Admitting: Certified Nurse Midwife

## 2018-03-29 ENCOUNTER — Encounter: Payer: Self-pay | Admitting: Obstetrics and Gynecology

## 2018-03-30 ENCOUNTER — Telehealth: Payer: Self-pay | Admitting: Certified Nurse Midwife

## 2018-03-30 ENCOUNTER — Encounter: Payer: Self-pay | Admitting: Obstetrics and Gynecology

## 2018-03-30 NOTE — Telephone Encounter (Signed)
The patient called and stated that she would like to speak with a nurse in regards to getting an order put in for her to do Amnio testing done at the hospital. The patient stated that the April the supervisor over at [facility] left two fax numbers to send the orders to. [phone] or [phone]. Please advise.

## 2018-03-30 NOTE — Telephone Encounter (Signed)
LMTRC

## 2018-03-31 NOTE — Telephone Encounter (Signed)
Pt is requesting amniocentesis for dna testing. She states her BF is unable to get paternity leave without a dna test. Informed pt that we do not order amnio for dna testing. She will have to wait until after delivery. Informed her that her genetic testing was all normal and there were risk involved with the amnio. Informed BF that I have never heard of a company requesting dna before granting paternity leave. I requested the number to his human resources department so I could get some clarification. I heard muffled sounds and the phone was disconnected. Discussed with AT.

## 2018-04-10 ENCOUNTER — Other Ambulatory Visit: Payer: Self-pay | Admitting: Certified Nurse Midwife

## 2018-04-10 ENCOUNTER — Ambulatory Visit (INDEPENDENT_AMBULATORY_CARE_PROVIDER_SITE_OTHER): Payer: 59 | Admitting: Certified Nurse Midwife

## 2018-04-10 ENCOUNTER — Other Ambulatory Visit (INDEPENDENT_AMBULATORY_CARE_PROVIDER_SITE_OTHER): Payer: 59

## 2018-04-10 VITALS — BP 107/61 | HR 79 | Wt 138.1 lb

## 2018-04-10 DIAGNOSIS — Z3689 Encounter for other specified antenatal screening: Secondary | ICD-10-CM

## 2018-04-10 DIAGNOSIS — Z3482 Encounter for supervision of other normal pregnancy, second trimester: Secondary | ICD-10-CM

## 2018-04-10 DIAGNOSIS — N83202 Unspecified ovarian cyst, left side: Secondary | ICD-10-CM

## 2018-04-10 LAB — POCT URINALYSIS DIPSTICK
Bilirubin, UA: NEGATIVE
Blood, UA: NEGATIVE
Glucose, UA: NEGATIVE
KETONES UA: NEGATIVE
LEUKOCYTES UA: NEGATIVE
NITRITE UA: NEGATIVE
PH UA: 6 (ref 5.0–8.0)
Spec Grav, UA: >=1.03 — AB (ref 1.010–1.025)
UROBILINOGEN UA: 0.2 U/dL

## 2018-04-10 MED ORDER — HYDROCORTISONE 1 % EX LOTN: 1.0000 "application " | TOPICAL_LOTION | Freq: Two times a day (BID) | CUTANEOUS | 0 refills | Status: DC

## 2018-04-10 MED ORDER — LOPERAMIDE HCL 2 MG PO TABS: 2.0000 mg | ORAL_TABLET | Freq: Four times a day (QID) | ORAL | 0 refills | Status: DC | PRN

## 2018-04-10 MED ORDER — ONDANSETRON HCL 4 MG PO TABS: 4.0000 mg | ORAL_TABLET | Freq: Three times a day (TID) | ORAL | 0 refills | Status: DC | PRN

## 2018-04-10 NOTE — Progress Notes (Signed)
ROB-pt having left side pelvic pain and back pain x 1 month. Pt stated that her pelvic hurts so bad that she is unable to walk at times and when she coughs or sneeze she has to hold her side.

## 2018-04-10 NOTE — Patient Instructions (Signed)
Common Medications Safe in Pregnancy  Acne:      Constipation:  Benzoyl Peroxide     Colace  Clindamycin      Dulcolax Suppository  Topica Erythromycin     Fibercon  Salicylic Acid      Metamucil         Miralax AVOID:        Senakot   Accutane    Cough:  Retin-A       Cough Drops  Tetracycline      Phenergan w/ Codeine if Rx  Minocycline      Robitussin (Plain & DM)  Antibiotics:     Crabs/Lice:  Ceclor       RID  Cephalosporins    AVOID:  E-Mycins      Kwell  Keflex  Macrobid/Macrodantin   Diarrhea:  Penicillin      Kao-Pectate  Zithromax      Imodium AD         PUSH FLUIDS AVOID:       Cipro     Fever:  Tetracycline      Tylenol (Regular or Extra  Minocycline       Strength)  Levaquin      Extra Strength-Do not          Exceed 8 tabs/24 hrs Caffeine:        <200mg/day (equiv. To 1 cup of coffee or  approx. 3 12 oz sodas)         Gas: Cold/Hayfever:       Gas-X  Benadryl      Mylicon  Claritin       Phazyme  **Claritin-D        Chlor-Trimeton    Headaches:  Dimetapp      ASA-Free Excedrin  Drixoral-Non-Drowsy     Cold Compress  Mucinex (Guaifenasin)     Tylenol (Regular or Extra  Sudafed/Sudafed-12 Hour     Strength)  **Sudafed PE Pseudoephedrine   Tylenol Cold & Sinus     Vicks Vapor Rub  Zyrtec  **AVOID if Problems With Blood Pressure         Heartburn: Avoid lying down for at least 1 hour after meals  Aciphex      Maalox     Rash:  Milk of Magnesia     Benadryl    Mylanta       1% Hydrocortisone Cream  Pepcid  Pepcid Complete   Sleep Aids:  Prevacid      Ambien   Prilosec       Benadryl  Rolaids       Chamomile Tea  Tums (Limit 4/day)     Unisom  Zantac       Tylenol PM         Warm milk-add vanilla or  Hemorrhoids:       Sugar for taste  Anusol/Anusol H.C.  (RX: Analapram 2.5%)  Sugar Substitutes:  Hydrocortisone OTC     Ok in moderation  Preparation H      Tucks        Vaseline lotion applied to tissue with  wiping    Herpes:     Throat:  Acyclovir      Oragel  Famvir  Valtrex     Vaccines:         Flu Shot Leg Cramps:       *Gardasil  Benadryl      Hepatitis A         Hepatitis B Nasal Spray:         Pneumovax  Saline Nasal Spray     Polio Booster         Tetanus Nausea:       Tuberculosis test or PPD  Vitamin B6 25 mg TID   AVOID:    Dramamine      *Gardasil  Emetrol       Live Poliovirus  Ginger Root 250 mg QID    MMR (measles, mumps &  High Complex Carbs @ Bedtime    rebella)  Sea Bands-Accupressure    Varicella (Chickenpox)  Unisom 1/2 tab TID     *No known complications           If received before Pain:         Known pregnancy;   Darvocet       Resume series after  Lortab        Delivery  Percocet    Yeast:   Tramadol      Femstat  Tylenol 3      Gyne-lotrimin  Ultram       Monistat  Vicodin           MISC:         All Sunscreens           Hair Coloring/highlights          Insect Repellant's          (Including DEET)         Mystic Tans Round Ligament Pain The round ligament is a cord of muscle and tissue that helps to support the uterus. It can become a source of pain during pregnancy if it becomes stretched or twisted as the baby grows. The pain usually begins in the second trimester of pregnancy, and it can come and go until the baby is delivered. It is not a serious problem, and it does not cause harm to the baby. Round ligament pain is usually a short, sharp, and pinching pain, but it can also be a dull, lingering, and aching pain. The pain is felt in the lower side of the abdomen or in the groin. It usually starts deep in the groin and moves up to the outside of the hip area. Pain can occur with:  A sudden change in position.  Rolling over in bed.  Coughing or sneezing.  Physical activity.  Follow these instructions at home: Watch your condition for any changes. Take these steps to help with your pain:  When the pain starts, relax. Then try: ? Sitting  down. ? Flexing your knees up to your abdomen. ? Lying on your side with one pillow under your abdomen and another pillow between your legs. ? Sitting in a warm bath for 15-20 minutes or until the pain goes away.  Take over-the-counter and prescription medicines only as told by your health care provider.  Move slowly when you sit and stand.  Avoid long walks if they cause pain.  Stop or lessen your physical activities if they cause pain.  Contact a health care provider if:  Your pain does not go away with treatment.  You feel pain in your back that you did not have before.  Your medicine is not helping. Get help right away if:  You develop a fever or chills.  You develop uterine contractions.  You develop vaginal bleeding.  You develop nausea or vomiting.  You develop diarrhea.  You have pain when you urinate. This information is not intended to replace advice given to you by your health  care provider. Make sure you discuss any questions you have with your health care provider. Document Released: 09/14/2008 Document Revised: 05/13/2016 Document Reviewed: 02/12/2015 Elsevier Interactive Patient Education  2018 [place]. Back Pain in Pregnancy Back pain during pregnancy is common. Back pain may be caused by several factors that are related to changes during your pregnancy. Follow these instructions at home: Managing pain, stiffness, and swelling  If directed, apply ice for sudden (acute) back pain. ? Put ice in a plastic bag. ? Place a towel between your skin and the bag. ? Leave the ice on for 20 minutes, 2-3 times per day.  If directed, apply heat to the affected area before you exercise: ? Place a towel between your skin and the heat pack or heating pad. ? Leave the heat on for 20-30 minutes. ? Remove the heat if your skin turns bright red. This is especially important if you are unable to feel pain, heat, or cold. You may have a greater risk of getting  burned. Activity  Exercise as told by your health care provider. Exercising is the best way to prevent or manage back pain.  Listen to your body when lifting. If lifting hurts, ask for help or bend your knees. This uses your leg muscles instead of your back muscles.  Squat down when picking up something from the floor. Do not bend over.  Only use bed rest as told by your health care provider. Bed rest should only be used for the most severe episodes of back pain. Standing, Sitting, and Lying Down  Do not stand in one place for long periods of time.  Use good posture when sitting. Make sure your head rests over your shoulders and is not hanging forward. Use a pillow on your lower back if necessary.  Try sleeping on your side, preferably the left side, with a pillow or two between your legs. If you are sore after a night's rest, your bed may be too soft. A firm mattress may provide more support for your back during pregnancy. General instructions  Do not wear high heels.  Eat a healthy diet. Try to gain weight within your health care provider's recommendations.  Use a maternity girdle, elastic sling, or back brace as told by your health care provider.  Take over-the-counter and prescription medicines only as told by your health care provider.  Keep all follow-up visits as told by your health care provider. This is important. This includes any visits with any specialists, such as a physical therapist. Contact a health care provider if:  Your back pain interferes with your daily activities.  You have increasing pain in other parts of your body. Get help right away if:  You develop numbness, tingling, weakness, or problems with the use of your arms or legs.  You develop severe back pain that is not controlled with medicine.  You have a sudden change in bowel or bladder control.  You develop shortness of breath, dizziness, or you faint.  You develop nausea, vomiting, or  sweating.  You have back pain that is a rhythmic, cramping pain similar to labor pains. Labor pain is usually 1-2 minutes apart, lasts for about 1 minute, and involves a bearing down feeling or pressure in your pelvis.  You have back pain and your water breaks or you have vaginal bleeding.  You have back pain or numbness that travels down your leg.  Your back pain developed after you fell.  You develop pain on one side  of your back.  You see blood in your urine.  You develop skin blisters in the area of your back pain. This information is not intended to replace advice given to you by your health care provider. Make sure you discuss any questions you have with your health care provider. Document Released: 03/16/2006 Document Revised: 05/13/2016 Document Reviewed: 08/20/2015 Elsevier Interactive Patient Education  2018 [employer]  

## 2018-04-11 DIAGNOSIS — N83202 Unspecified ovarian cyst, left side: Secondary | ICD-10-CM | POA: Insufficient documentation

## 2018-04-11 NOTE — Progress Notes (Signed)
ROB-Reports intermittent left sided pain as well as diarrhea and vomiting. Discussed home treatment measures for round ligament pain including abdominal support. Rx: Imodium and Zofran, see orders. Anatomy scan complete and wnl. FMLA paperwork updated to reflect pregnancy restrictions. Reviewed red flag symptoms and when to call. RTC x 2-3 weeks for ROB or sooner if needed.   ULTRASOUND REPORT  Location: [facility] Date of Service:  04/10/2018  Indications: Anatomy Findings:  Mason JimSingleton intrauterine pregnancy is visualized with FHR at 149 BPM. Biometrics give an (U/S) Gestational age of 32 3/7 weeks and an (U/S) EDD of 08/18/18; this correlates with the clinically established EDD of 08/17/18.  Fetal presentation is vertex.  EFW: 421 grams (0lb 15oz). Placenta: Anterior and grade 1. AFI: WNL subejctively.  Anatomic survey is complete and appears WNL; Gender - Surprise.   Right Ovary measures 2.0 x 1.7 x 1.7 cm. It is normal in appearance. Left Ovary measures 3.5 x 2.9 x 2.7 cm and has echogenic appearance typical of a dermoid.  There is no obvious evidence of a corpus luteal cyst. Survey of the adnexa demonstrates no adnexal masses. There is no free peritoneal fluid in the cul de sac.  Impression: 1. 21 3/7 week Viable Singleton Intrauterine pregnancy by U/S. 2. (U/S) EDD is consistent with Clinically established (LMP) EDD of 08/17/18. 3. Normal Anatomy Scan 4. 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  Recommendations: 1.Clinical correlation with the patient's History and Physical Exam.

## 2018-04-17 ENCOUNTER — Encounter: Payer: Self-pay | Admitting: Certified Nurse Midwife

## 2018-04-19 ENCOUNTER — Telehealth: Payer: Self-pay | Admitting: Certified Nurse Midwife

## 2018-04-19 NOTE — Telephone Encounter (Signed)
lmtrc

## 2018-04-19 NOTE — Telephone Encounter (Signed)
The patient called and stated that she has not received a call back from [place], she sent a message back to her on 04/17/18 via My-Chart.   The message sent was: "good morning mrs michelle. i spoke with the ada about the time we we're discussing. the lady stated there is no certain time frame. for example a day a week 3 days a month ect. also remember we discussed the time goes towards me using the rest room and needing a few mins off the phone while im throwing up at work. so whatever time you feel is good to update id appreciate it. any questions please feel free to call me.    Teresa Bond"  Please advise.

## 2018-04-20 ENCOUNTER — Telehealth: Payer: Self-pay | Admitting: Certified Nurse Midwife

## 2018-04-20 NOTE — Telephone Encounter (Signed)
The patient called and stated that she missed a call from Crystal and would, like to have a call back between the hours of 2p-3p on her break. Please advise.

## 2018-04-21 ENCOUNTER — Encounter: Payer: Self-pay | Admitting: Certified Nurse Midwife

## 2018-04-21 NOTE — Telephone Encounter (Signed)
Spoke with pt. Pt needs letter with restrictions while at work. Letter faxed to [employer] with confirmation. JML aware.

## 2018-04-21 NOTE — Telephone Encounter (Signed)
See previous telephone encounter.

## 2018-05-10 ENCOUNTER — Telehealth: Payer: Self-pay | Admitting: Certified Nurse Midwife

## 2018-05-10 NOTE — Telephone Encounter (Signed)
The patient called and stated that she would like Crystal to know that the form she fill out was insuffient and that the patient will bring another copy of the form to her appointment tomorrow on 05/11/18. Please advise.

## 2018-05-10 NOTE — Telephone Encounter (Signed)
Lm asking pt for clarification on what form. Did do a restrictions letter on 5/3. ???

## 2018-05-11 ENCOUNTER — Ambulatory Visit (INDEPENDENT_AMBULATORY_CARE_PROVIDER_SITE_OTHER): Payer: 59 | Admitting: Certified Nurse Midwife

## 2018-05-11 VITALS — BP 116/68 | HR 70 | Wt 140.6 lb

## 2018-05-11 DIAGNOSIS — N898 Other specified noninflammatory disorders of vagina: Secondary | ICD-10-CM

## 2018-05-11 DIAGNOSIS — Z3482 Encounter for supervision of other normal pregnancy, second trimester: Secondary | ICD-10-CM

## 2018-05-11 DIAGNOSIS — O26892 Other specified pregnancy related conditions, second trimester: Secondary | ICD-10-CM

## 2018-05-11 LAB — POCT URINALYSIS DIPSTICK
Bilirubin, UA: NEGATIVE
Blood, UA: NEGATIVE
Glucose, UA: NEGATIVE
KETONES UA: NEGATIVE
Leukocytes, UA: NEGATIVE
NITRITE UA: NEGATIVE
PH UA: 7 (ref 5.0–8.0)
Protein, UA: NEGATIVE
Spec Grav, UA: 1.01 (ref 1.010–1.025)
UROBILINOGEN UA: 0.2 U/dL

## 2018-05-11 NOTE — Patient Instructions (Addendum)
WHAT OB PATIENTS CAN EXPECT   Confirmation of pregnancy and ultrasound ordered if medically indicated-[redacted] weeks gestation  New OB (NOB) intake with nurse and New OB (NOB) labs- [redacted] weeks gestation  New OB (NOB) physical examination with provider- 11/[redacted] weeks gestation  Flu vaccine-[redacted] weeks gestation  Anatomy scan-[redacted] weeks gestation  Glucose tolerance test, blood work to test for anemia, T-dap vaccine-[redacted] weeks gestation  Vaginal swabs/cultures-STD/Group B strep-[redacted] weeks gestation  Appointments every 4 weeks until 28 weeks  Every 2 weeks from 28 weeks until 36 weeks  Weekly visits from 36 weeks until delivery  Third Trimester of Pregnancy The third trimester is from week 29 through week 42, months 7 through 9. This trimester is when your unborn baby (fetus) is growing very fast. At the end of the ninth month, the unborn baby is about 20 inches in length. It weighs about 6-10 pounds. Follow these instructions at home:  Avoid all smoking, herbs, and alcohol. Avoid drugs not approved by your doctor.  Do not use any tobacco products, including cigarettes, chewing tobacco, and electronic cigarettes. If you need help quitting, ask your doctor. You may get counseling or other support to help you quit.  Only take medicine as told by your doctor. Some medicines are safe and some are not during pregnancy.  Exercise only as told by your doctor. Stop exercising if you start having cramps.  Eat regular, healthy meals.  Wear a good support bra if your breasts are tender.  Do not use hot tubs, steam rooms, or saunas.  Wear your seat belt when driving.  Avoid raw meat, uncooked cheese, and liter boxes and soil used by cats.  Take your prenatal vitamins.  Take 1500-2000 milligrams of calcium daily starting at the 20th week of pregnancy until you deliver your baby.  Try taking medicine that helps you poop (stool softener) as needed, and if your doctor approves. Eat more fiber by eating fresh  fruit, vegetables, and whole grains. Drink enough fluids to keep your pee (urine) clear or pale yellow.  Take warm water baths (sitz baths) to soothe pain or discomfort caused by hemorrhoids. Use hemorrhoid cream if your doctor approves.  If you have puffy, bulging veins (varicose veins), wear support hose. Raise (elevate) your feet for 15 minutes, 3-4 times a day. Limit salt in your diet.  Avoid heavy lifting, wear low heels, and sit up straight.  Rest with your legs raised if you have leg cramps or low back pain.  Visit your dentist if you have not gone during your pregnancy. Use a soft toothbrush to brush your teeth. Be gentle when you floss.  You can have sex (intercourse) unless your doctor tells you not to.  Do not travel far distances unless you must. Only do so with your doctor's approval.  Take prenatal classes.  Practice driving to the hospital.  Pack your hospital bag.  Prepare the baby's room.  Go to your doctor visits. Get help if:  You are not sure if you are in labor or if your water has broken.  You are dizzy.  You have mild cramps or pressure in your lower belly (abdominal).  You have a nagging pain in your belly area.  You continue to feel sick to your stomach (nauseous), throw up (vomit), or have watery poop (diarrhea).  You have bad smelling fluid coming from your vagina.  You have pain with peeing (urination). Get help right away if:  You have a fever.  You are  leaking fluid from your vagina.  You are spotting or bleeding from your vagina.  You have severe belly cramping or pain.  You lose or gain weight rapidly.  You have trouble catching your breath and have chest pain.  You notice sudden or extreme puffiness (swelling) of your face, hands, ankles, feet, or legs.  You have not felt the baby move in over an hour.  You have severe headaches that do not go away with medicine.  You have vision changes. This information is not intended to  replace advice given to you by your health care provider. Make sure you discuss any questions you have with your health care provider. Document Released: 03/02/2010 Document Revised: 05/13/2016 Document Reviewed: 02/06/2013 Elsevier Interactive Patient Education  2017 [employer]. Common Medications Safe in Pregnancy  Acne:      Constipation:  Benzoyl Peroxide     Colace  Clindamycin      Dulcolax Suppository  Topica Erythromycin     Fibercon  Salicylic Acid      Metamucil         Miralax AVOID:        Senakot   Accutane    Cough:  Retin-A       Cough Drops  Tetracycline      Phenergan w/ Codeine if Rx  Minocycline      Robitussin (Plain & DM)  Antibiotics:     Crabs/Lice:  Ceclor       RID  Cephalosporins    AVOID:  E-Mycins      Kwell  Keflex  Macrobid/Macrodantin   Diarrhea:  Penicillin      Kao-Pectate  Zithromax      Imodium AD         PUSH FLUIDS AVOID:       Cipro     Fever:  Tetracycline      Tylenol (Regular or Extra  Minocycline       Strength)  Levaquin      Extra Strength-Do not          Exceed 8 tabs/24 hrs Caffeine:        <246m/day (equiv. To 1 cup of coffee or  approx. 3 12 oz sodas)         Gas: Cold/Hayfever:       Gas-X  Benadryl      Mylicon  Claritin       Phazyme  **Claritin-D        Chlor-Trimeton    Headaches:  Dimetapp      ASA-Free Excedrin  Drixoral-Non-Drowsy     Cold Compress  Mucinex (Guaifenasin)     Tylenol (Regular or Extra  Sudafed/Sudafed-12 Hour     Strength)  **Sudafed PE Pseudoephedrine   Tylenol Cold & Sinus     Vicks Vapor Rub  Zyrtec  **AVOID if Problems With Blood Pressure         Heartburn: Avoid lying down for at least 1 hour after meals  Aciphex      Maalox     Rash:  Milk of Magnesia     Benadryl    Mylanta       1% Hydrocortisone Cream  Pepcid  Pepcid Complete   Sleep Aids:  Prevacid      Ambien   Prilosec       Benadryl  Rolaids       Chamomile Tea  Tums (Limit  4/day)     Unisom  Zantac       Tylenol PM  Warm milk-add vanilla or  Hemorrhoids:       Sugar for taste  Anusol/Anusol H.C.  (RX: Analapram 2.5%)  Sugar Substitutes:  Hydrocortisone OTC     Ok in moderation  Preparation H      Tucks        Vaseline lotion applied to tissue with wiping    Herpes:     Throat:  Acyclovir      Oragel  Famvir  Valtrex     Vaccines:         Flu Shot Leg Cramps:       *Gardasil  Benadryl      Hepatitis A         Hepatitis B Nasal Spray:       Pneumovax  Saline Nasal Spray     Polio Booster         Tetanus Nausea:       Tuberculosis test or PPD  Vitamin B6 25 mg TID   AVOID:    Dramamine      *Gardasil  Emetrol       Live Poliovirus  Ginger Root 250 mg QID    MMR (measles, mumps &  High Complex Carbs @ Bedtime    rebella)  Sea Bands-Accupressure    Varicella (Chickenpox)  Unisom 1/2 tab TID     *No known complications           If received before Pain:         Known pregnancy;   Darvocet       Resume series after  Lortab        Delivery  Percocet    Yeast:   Tramadol      Femstat  Tylenol 3      Gyne-lotrimin  Ultram       Monistat  Vicodin           MISC:         All Sunscreens           Hair Coloring/highlights          Insect Repellant's          (Including DEET)         Mystic Tans  Pregnancy and Travel Most pregnant woman can safely travel until the last month of the pregnancy. However, pregnant women with medical problems or problems with their pregnancy should limit or avoid travel. The best time to travel is between 14 and 28 weeks of the pregnancy. During this period, morning sickness should be minimal and other problems are less likely to develop. General travel tips Before you go:  Discuss your trip with your health care provider and get examined shortly before you go.  Get a copy of your medical records and be sure to take them with you.  Try to get names of doctors and hospitals in the area you will be  visiting.  Pack your pillow if you can.  Get a good night's sleep the night before you make your trip.  During your trip:  Ask for locations of doctors and hospitals if you did not do this before leaving.  Wear flat, comfortable shoes.  Eat a balanced diet, drink a lot of fluids, and take your vitamins and supplements.  Do not wear yourself out.  Do not ride on a motorcycle.  Rest. If you spent a lot of time traveling, lie down for 30 or more minutes with your feet slightly raised after you reach your destination.  Tips  for traveling to a foreign country Before you go:  Ask your health care provider if there are any medicines that are safe to take if you get diarrhea, constipation, nausea, or vomiting.  Make copies of your medical records in case you lose the originals.  During your trip:  Do not eat uncooked foods of any kind.  Drink bottled water and do not use ice.  Wash fruits and vegetables with hot, soapy water.  Only drink pasteurized milk.  Tips for traveling by car  Wear your seat belt properly.  If you are in the front seat, sit as far away from the dashboard as possible to avoid getting hit hard if the air bag deploys in an accident.  Stop about every 2 hours to use the restroom and walk around. This helps the circulation in your legs.  Keep water, crackers, and fruit in the car.  Do not travel for more than 6 hours a day. Tips for traveling by bus  Before making a reservation, ask whether your bus will have a restroom.  Take water, crackers, and fruit with you.  Get out and walk around if and when the bus stops.  Move your arms and legs when seated. This helps with your circulation. Tips for traveling by train Before making a reservation, ask if your train will have a sleeping car and more than one restroom. Tips for traveling by airplane  Before booking your trip, ask about the airline's rules about pregnancy. Pregnant women may be restricted  from [place] after a certain time of the pregnancy. Every airline has its own rules and regulations.  Ask whether the airplane cabin will be pressurized. Do not board an unpressurized plane that will fly above 7,000 ft (2,100 km).  Try to get a bulkhead or an aisle seat.  Wear layered clothing because the temperature in the cabin can change.  Take water, crackers, and fruit with you on the airplane.  Put all your medicines and medical records in your carry-on bag.  Avoid drinks with caffeine and do not eat a big meal.  Do not walk around the airplane to stretch your legs.  Move your arms and legs while sitting to help with your circulation.  Wear your seat belt at all times. Tips for traveling by cruise ship  Before booking your trip, ask the following questions: ? Are pregnant women allowed on the cruise ship? ? Is there a medical facility and health care provider on board? ? Does the ship dock in cities where there are health care providers and medical facilities?  Before booking your trip, ask your health care provider if: ? It is safe for you to take medicines if you get seasick. ? It is safe for you to wear acupressure wristbands to prevent getting seasick. If your health care provider says it is safe, consider purchasing one. This information is not intended to replace advice given to you by your health care provider. Make sure you discuss any questions you have with your health care provider. Document Released: 11/18/2008 Document Revised: 05/13/2016 Document Reviewed: 11/02/2013 Elsevier Interactive Patient Education  2017 [employer].

## 2018-05-11 NOTE — Telephone Encounter (Signed)
Pt in office now.   SS to get clarification and give additional letter if needed.

## 2018-05-11 NOTE — Progress Notes (Signed)
Pt is here for an ROB visit. C/o heavy vaginal discharge with odor. Screening 8

## 2018-05-12 ENCOUNTER — Telehealth: Payer: Self-pay | Admitting: Obstetrics and Gynecology

## 2018-05-12 NOTE — Progress Notes (Signed)
ROB-Reports fatigue, mood changes, increased vaginal discharge, and intermittent vomiting. Samples of [place] given and review of home treatment measures for discomforts of pregnancy. Discussed upcoming travel to [place] to pick up daughter for the summer. Education regarding pregnancy and travel precautions; handouts given. Encouraged use of abdominal support and compression stockings; order form given. Will update FMLA, paper to LPN/CMA for completion. NuSwab collected, will contact patient with results. Anticipatory guidance regarding course of prenatal care and transfer to MD care for repeat c-section, patient verbalized understanding. Reviewed red flag symptoms and when to call. RTC x 2 weeks for 28 week labs and ROB or sooner if needed.      Routine Prenatal from 05/11/2018 in [facility] Total Score  8

## 2018-05-12 NOTE — Telephone Encounter (Signed)
The patient called and stated that she would like to know if her forms have been faxed yet. No other information was disclosed. Please advise.

## 2018-05-12 NOTE — Telephone Encounter (Signed)
Pt aware SS and AC are working on her paperwork. Will try to get it faxed on Tuesday.

## 2018-05-16 ENCOUNTER — Encounter: Payer: Self-pay | Admitting: Certified Nurse Midwife

## 2018-05-16 ENCOUNTER — Other Ambulatory Visit: Payer: Self-pay | Admitting: Certified Nurse Midwife

## 2018-05-16 ENCOUNTER — Telehealth: Payer: Self-pay

## 2018-05-16 LAB — NUSWAB VAGINITIS PLUS (VG+)
Candida albicans, NAA: NEGATIVE
Candida glabrata, NAA: NEGATIVE
Chlamydia trachomatis, NAA: NEGATIVE
Megasphaera 1: HIGH Score — AB
NEISSERIA GONORRHOEAE, NAA: NEGATIVE
TRICH VAG BY NAA: NEGATIVE

## 2018-05-16 MED ORDER — METRONIDAZOLE 500 MG PO TABS: 500.0000 mg | ORAL_TABLET | Freq: Two times a day (BID) | ORAL | 0 refills | Status: AC

## 2018-05-16 NOTE — Telephone Encounter (Signed)
Mychart message sent informing pt paperwork has been faxed and confirmation received.

## 2018-05-26 ENCOUNTER — Encounter: Payer: Self-pay | Admitting: Obstetrics and Gynecology

## 2018-05-26 ENCOUNTER — Ambulatory Visit (INDEPENDENT_AMBULATORY_CARE_PROVIDER_SITE_OTHER): Payer: 59 | Admitting: Obstetrics and Gynecology

## 2018-05-26 ENCOUNTER — Other Ambulatory Visit: Payer: 59

## 2018-05-26 VITALS — BP 92/53 | HR 81 | Wt 141.2 lb

## 2018-05-26 DIAGNOSIS — O0993 Supervision of high risk pregnancy, unspecified, third trimester: Secondary | ICD-10-CM

## 2018-05-26 DIAGNOSIS — O34211 Maternal care for low transverse scar from previous cesarean delivery: Secondary | ICD-10-CM

## 2018-05-26 LAB — POCT URINALYSIS DIPSTICK
BILIRUBIN UA: NEGATIVE
Glucose, UA: NEGATIVE
Leukocytes, UA: NEGATIVE
Nitrite, UA: NEGATIVE
Protein, UA: NEGATIVE
RBC UA: NEGATIVE
Urobilinogen, UA: 0.2 E.U./dL
pH, UA: 7.5 (ref 5.0–8.0)

## 2018-05-26 MED ORDER — TETANUS-DIPHTH-ACELL PERTUSSIS 5-2.5-18.5 LF-MCG/0.5 IM SUSP
0.5000 mL | Freq: Once | INTRAMUSCULAR | Status: AC
  Administered 2018-05-26: 0.5 mL via INTRAMUSCULAR

## 2018-05-26 NOTE — Progress Notes (Signed)
ROB: Patient transferring from midwifery care secondary to to h/o prior C-section, desiring repeat C-section. Also with small left dermoid cyst. Reviewed previous labs and care. Does note morning sickness symptoms still. Not as bad, more tolerable now. Has tried Zofran, Vitamin B6.  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 Also notes recent stressors, including brother recently being incarcerated in [place], and stressful job, is having trouble sleeping. Discussed OTC sleep aids, stress relievers. For 28 week labs today.  Desires to breastfeed,  desires BTL for contraception, would also like cyst to be removed on left ovary. For Tdap today, signed blood consent.  Medicaid forms signed today after discussion of all contraception options. Notes that she would like for midwife (preferably Marcelino DusterMichelle as she really likes her) presence during her C-section.  RTC in 2 weeks.

## 2018-05-26 NOTE — Progress Notes (Signed)
ROB-pt stated that she is still getting sick on the stomach and having morning sickness. No other concerns.

## 2018-05-27 LAB — CBC
HEMATOCRIT: 32.4 % — AB (ref 34.0–46.6)
HEMOGLOBIN: 11 g/dL — AB (ref 11.1–15.9)
MCH: 29.9 pg (ref 26.6–33.0)
MCHC: 34 g/dL (ref 31.5–35.7)
MCV: 88 fL (ref 79–97)
Platelets: 260 10*3/uL (ref 150–450)
RBC: 3.68 x10E6/uL — AB (ref 3.77–5.28)
RDW: 13.5 % (ref 12.3–15.4)
WBC: 10.3 10*3/uL (ref 3.4–10.8)

## 2018-05-27 LAB — RPR: RPR: NONREACTIVE

## 2018-05-27 LAB — GLUCOSE, 1 HOUR GESTATIONAL: GESTATIONAL DIABETES SCREEN: 187 mg/dL — AB (ref 65–139)

## 2018-06-05 ENCOUNTER — Other Ambulatory Visit: Payer: Self-pay

## 2018-06-05 DIAGNOSIS — Z3483 Encounter for supervision of other normal pregnancy, third trimester: Secondary | ICD-10-CM

## 2018-06-05 NOTE — Addendum Note (Signed)
Addended by: Silvano BilisHAMPTON, Aylssa Herrig L on: 06/05/2018 09:48 AM   Modules accepted: Orders

## 2018-06-08 ENCOUNTER — Encounter: Payer: 59 | Admitting: Obstetrics and Gynecology

## 2018-06-13 ENCOUNTER — Ambulatory Visit (INDEPENDENT_AMBULATORY_CARE_PROVIDER_SITE_OTHER): Payer: 59 | Admitting: Obstetrics and Gynecology

## 2018-06-13 ENCOUNTER — Other Ambulatory Visit: Payer: 59

## 2018-06-13 VITALS — BP 87/55 | HR 82 | Wt 143.8 lb

## 2018-06-13 DIAGNOSIS — O0993 Supervision of high risk pregnancy, unspecified, third trimester: Secondary | ICD-10-CM

## 2018-06-13 DIAGNOSIS — O34211 Maternal care for low transverse scar from previous cesarean delivery: Secondary | ICD-10-CM

## 2018-06-13 DIAGNOSIS — Z3483 Encounter for supervision of other normal pregnancy, third trimester: Secondary | ICD-10-CM

## 2018-06-13 LAB — POCT URINALYSIS DIPSTICK
Bilirubin, UA: NEGATIVE
GLUCOSE UA: NEGATIVE
Ketones, UA: NEGATIVE
Leukocytes, UA: NEGATIVE
NITRITE UA: NEGATIVE
PH UA: 7.5 (ref 5.0–8.0)
PROTEIN UA: POSITIVE — AB
RBC UA: NEGATIVE
SPEC GRAV UA: 1.015 (ref 1.010–1.025)
UROBILINOGEN UA: 0.2 U/dL

## 2018-06-13 NOTE — Progress Notes (Signed)
ROB: patient desires tubal ligation with repeat C-section.  Also desires removal of dermoid cyst if possible.

## 2018-06-13 NOTE — Progress Notes (Signed)
ROB-pt stated that she is having lower pelvic pain. No other complaints.

## 2018-06-14 LAB — CBC
HEMOGLOBIN: 10.9 g/dL — AB (ref 11.1–15.9)
Hematocrit: 33.5 % — ABNORMAL LOW (ref 34.0–46.6)
MCH: 29.5 pg (ref 26.6–33.0)
MCHC: 32.5 g/dL (ref 31.5–35.7)
MCV: 91 fL (ref 79–97)
PLATELETS: 279 10*3/uL (ref 150–450)
RBC: 3.7 x10E6/uL — ABNORMAL LOW (ref 3.77–5.28)
RDW: 13.6 % (ref 12.3–15.4)
WBC: 9.6 10*3/uL (ref 3.4–10.8)

## 2018-06-14 LAB — GESTATIONAL GLUCOSE TOLERANCE
GLUCOSE 2 HOUR GTT: 71 mg/dL (ref 65–154)
GLUCOSE 3 HOUR GTT: 85 mg/dL (ref 65–139)
GLUCOSE FASTING: 74 mg/dL (ref 65–94)
Glucose, GTT - 1 Hour: 135 mg/dL (ref 65–179)

## 2018-06-14 LAB — RPR: RPR: NONREACTIVE

## 2018-06-20 ENCOUNTER — Encounter: Payer: Self-pay | Admitting: Obstetrics and Gynecology

## 2018-06-27 ENCOUNTER — Ambulatory Visit (INDEPENDENT_AMBULATORY_CARE_PROVIDER_SITE_OTHER): Payer: 59 | Admitting: Obstetrics and Gynecology

## 2018-06-27 ENCOUNTER — Encounter (INDEPENDENT_AMBULATORY_CARE_PROVIDER_SITE_OTHER): Payer: Self-pay

## 2018-06-27 VITALS — BP 96/65 | HR 81 | Wt 146.3 lb

## 2018-06-27 DIAGNOSIS — O34219 Maternal care for unspecified type scar from previous cesarean delivery: Secondary | ICD-10-CM

## 2018-06-27 DIAGNOSIS — O99013 Anemia complicating pregnancy, third trimester: Secondary | ICD-10-CM

## 2018-06-27 DIAGNOSIS — O0993 Supervision of high risk pregnancy, unspecified, third trimester: Secondary | ICD-10-CM

## 2018-06-27 LAB — POCT URINALYSIS DIPSTICK
Bilirubin, UA: NEGATIVE
Blood, UA: NEGATIVE
Glucose, UA: NEGATIVE
KETONES UA: NEGATIVE
Leukocytes, UA: NEGATIVE
Nitrite, UA: NEGATIVE
PH UA: 7.5 (ref 5.0–8.0)
PROTEIN UA: NEGATIVE
SPEC GRAV UA: 1.01 (ref 1.010–1.025)
UROBILINOGEN UA: 0.2 U/dL

## 2018-06-27 NOTE — Progress Notes (Signed)
ROB: C/o pelvic pressure. Discussed pregnancy support garments. Normal 3 hr GTT. Mild anemia of pregnancy, previously recommended increasing iron intake through foods or daily OTC iron supplement. RTC in 2 weeks. To schedule C-section for 08/10/2018.

## 2018-06-27 NOTE — Progress Notes (Signed)
ROB-pt stated that she having lower pelvic pressure.

## 2018-06-29 ENCOUNTER — Telehealth: Payer: Self-pay | Admitting: Obstetrics and Gynecology

## 2018-06-29 NOTE — Telephone Encounter (Signed)
Patient called stating she is very sweating with tingly arms. She states she feels as if she can almost pass out. Please Advise.Thanks

## 2018-06-29 NOTE — Telephone Encounter (Signed)
Pt stated that her finger, hands, legs and toes are going numb. Pt was asked if she was taking her iron pills, eating, drinking, not being outside in the heat and relaxing. Pt was also asked if she was checking her bp regularly. pt stated that she had no way of checking her bp.  Pt stated that she has been dizzy when she sat down and stand. Pt was given the number to L&D to call if she needed them.

## 2018-07-07 ENCOUNTER — Encounter: Payer: Self-pay | Admitting: Obstetrics and Gynecology

## 2018-07-08 ENCOUNTER — Observation Stay
Admission: EM | Admit: 2018-07-08 | Discharge: 2018-07-08 | Disposition: A | Discharge status: Home / Self Care | Payer: 59 | Attending: Obstetrics and Gynecology | Admitting: Obstetrics and Gynecology

## 2018-07-08 DIAGNOSIS — O34219 Maternal care for unspecified type scar from previous cesarean delivery: Secondary | ICD-10-CM | POA: Diagnosis not present

## 2018-07-08 DIAGNOSIS — Z3A34 34 weeks gestation of pregnancy: Secondary | ICD-10-CM | POA: Insufficient documentation

## 2018-07-08 DIAGNOSIS — O26893 Other specified pregnancy related conditions, third trimester: Secondary | ICD-10-CM | POA: Diagnosis not present

## 2018-07-08 DIAGNOSIS — M549 Dorsalgia, unspecified: Secondary | ICD-10-CM | POA: Insufficient documentation

## 2018-07-08 DIAGNOSIS — N83202 Unspecified ovarian cyst, left side: Secondary | ICD-10-CM

## 2018-07-08 DIAGNOSIS — O4703 False labor before 37 completed weeks of gestation, third trimester: Secondary | ICD-10-CM

## 2018-07-08 DIAGNOSIS — F1290 Cannabis use, unspecified, uncomplicated: Secondary | ICD-10-CM

## 2018-07-08 DIAGNOSIS — Z3490 Encounter for supervision of normal pregnancy, unspecified, unspecified trimester: Secondary | ICD-10-CM

## 2018-07-08 LAB — URINALYSIS, ROUTINE W REFLEX MICROSCOPIC
Bilirubin Urine: NEGATIVE
Glucose, UA: NEGATIVE mg/dL
Hgb urine dipstick: NEGATIVE
Ketones, ur: NEGATIVE mg/dL
LEUKOCYTES UA: NEGATIVE
NITRITE: NEGATIVE
PROTEIN: NEGATIVE mg/dL
Specific Gravity, Urine: 1.011 (ref 1.005–1.030)
pH: 7 (ref 5.0–8.0)

## 2018-07-08 LAB — ROM PLUS (ARMC ONLY): ROM PLUS: NEGATIVE

## 2018-07-08 MED ORDER — ACETAMINOPHEN 500 MG PO TABS
ORAL_TABLET | ORAL | Status: AC
  Filled 2018-07-08: qty 2

## 2018-07-08 MED ORDER — LACTATED RINGERS IV BOLUS
1000.0000 mL | Freq: Once | INTRAVENOUS | Status: AC
  Administered 2018-07-08: 1000 mL via INTRAVENOUS

## 2018-07-08 MED ORDER — ACETAMINOPHEN 500 MG PO TABS
1000.0000 mg | ORAL_TABLET | Freq: Four times a day (QID) | ORAL | Status: DC | PRN
  Administered 2018-07-08: 1000 mg via ORAL

## 2018-07-08 NOTE — OB Triage Note (Signed)
G5P2 6256w2d previous c/s x2 presents to [facility] d/t possible LOF that began yesterday morning. She has been having constant, throbbing back pain since Wednesday that has "gotten worse each day" and she felt "pressure" last night that was gone when she woke up this morning. She has been wearing a pad which is "damp" but not saturated. She denies bleeding and reports positive fetal movement but "less" this morning. Monitors applied and assessing.

## 2018-07-09 DIAGNOSIS — O4703 False labor before 37 completed weeks of gestation, third trimester: Secondary | ICD-10-CM

## 2018-07-09 NOTE — Final Progress Note (Signed)
L&D OB Triage Note  Lavinia Sharpsammy R Sipp is a 32 y.o. [id number] female at 4031w2d, EDD Estimated Date of Delivery: 08/17/18 with a h/o prior C-section x 2 desiring repeat, who presented to triage for complaints of possible LOF (since yesterday), back pain for several days.  She was evaluated by the nurses with no significant findings for labor or ruptured membranes, however was noted to have preterm contractions. Vital signs stable. An NST was performed and has been reviewed by MD. She was treated with Tylenol ES and IVF bolus of 1L.   NST INTERPRETATION: Indications: rule out uterine contractions  Mode: External Baseline Rate (A): 130 bpm Variability: Moderate Accelerations: 15 x 15 Decelerations: None     Contraction Frequency (min): 7-10  Impression: reactive   Labs:  Results for orders placed or performed during the hospital encounter of 07/08/18  ROM Plus ([facility] only)  Result Value Ref Range   Rom Plus NEGATIVE   Urinalysis, Routine w reflex microscopic  Result Value Ref Range   Color, Urine YELLOW (A) YELLOW   APPearance HAZY (A) CLEAR   Specific Gravity, Urine 1.011 1.005 - 1.030   pH 7.0 5.0 - 8.0   Glucose, UA NEGATIVE NEGATIVE mg/dL   Hgb urine dipstick NEGATIVE NEGATIVE   Bilirubin Urine NEGATIVE NEGATIVE   Ketones, ur NEGATIVE NEGATIVE mg/dL   Protein, ur NEGATIVE NEGATIVE mg/dL   Nitrite NEGATIVE NEGATIVE   Leukocytes, UA NEGATIVE NEGATIVE     Plan: NST performed was reviewed and was found to be reactive. Her contractions dissipated and her back pain resoled.  She was discharged home with bleeding/preterm labor precautions.  Continue routine prenatal care. Follow up with OB/GYN as previously scheduled.     Hildred LaserAnika Shenaya Lebo, MD [facility]

## 2018-07-11 ENCOUNTER — Encounter: Payer: Self-pay | Admitting: Obstetrics and Gynecology

## 2018-07-11 ENCOUNTER — Ambulatory Visit (INDEPENDENT_AMBULATORY_CARE_PROVIDER_SITE_OTHER): Payer: 59 | Admitting: Obstetrics and Gynecology

## 2018-07-11 VITALS — BP 104/71 | HR 97 | Wt 151.7 lb

## 2018-07-11 DIAGNOSIS — O0993 Supervision of high risk pregnancy, unspecified, third trimester: Secondary | ICD-10-CM | POA: Diagnosis not present

## 2018-07-11 LAB — POCT URINALYSIS DIPSTICK
Bilirubin, UA: NEGATIVE
Blood, UA: NEGATIVE
Glucose, UA: NEGATIVE
Ketones, UA: NEGATIVE
Leukocytes, UA: NEGATIVE
NITRITE UA: NEGATIVE
Protein, UA: NEGATIVE
Spec Grav, UA: 1.01 (ref 1.010–1.025)
UROBILINOGEN UA: NEGATIVE U/dL — AB
pH, UA: 7.5 (ref 5.0–8.0)

## 2018-07-11 NOTE — Progress Notes (Addendum)
ROB: Patient seen over the weekend with back pain and occasional contractions.  She states this pain has continued on and off for the last few days.  Not any worse.  She just remains uncomfortable.  Discussed hydration and comfort measures.Cultures next visit. Patient still requesting teratoma removal at C-section - will consider and discuss in detail with patient.  Not scheduled yet so I scheduled it with Kristal and L&D for 8-23 block time.

## 2018-07-11 NOTE — Progress Notes (Signed)
ROB, Pt states her lower back is in pain and is having trouble walking. She went to emergency department Saturday with contractions.

## 2018-07-19 ENCOUNTER — Ambulatory Visit (INDEPENDENT_AMBULATORY_CARE_PROVIDER_SITE_OTHER): Payer: 59 | Admitting: Obstetrics and Gynecology

## 2018-07-19 VITALS — BP 99/63 | HR 83 | Wt 152.5 lb

## 2018-07-19 DIAGNOSIS — O34219 Maternal care for unspecified type scar from previous cesarean delivery: Secondary | ICD-10-CM

## 2018-07-19 DIAGNOSIS — Z3483 Encounter for supervision of other normal pregnancy, third trimester: Secondary | ICD-10-CM

## 2018-07-19 LAB — POCT URINALYSIS DIPSTICK
BILIRUBIN UA: NEGATIVE
GLUCOSE UA: NEGATIVE
Ketones, UA: NEGATIVE
Leukocytes, UA: NEGATIVE
Nitrite, UA: NEGATIVE
PH UA: 7.5 (ref 5.0–8.0)
Protein, UA: POSITIVE — AB
RBC UA: NEGATIVE
Spec Grav, UA: 1.01 (ref 1.010–1.025)
Urobilinogen, UA: 0.2 E.U./dL

## 2018-07-19 NOTE — Addendum Note (Signed)
Addended by: Silvano BilisHAMPTON, Thoren Hosang L on: 07/19/2018 10:01 AM   Modules accepted: Orders

## 2018-07-19 NOTE — Progress Notes (Signed)
ROB: Patient notes lots of pressure in vaginal rea. Also concerned about BPs being so low. Is increasing salt intake and fluid intake. Given reassurance. 36 week labs done today.  RTC in 1 week.

## 2018-07-19 NOTE — Addendum Note (Signed)
Addended by: Silvano BilisHAMPTON, Zykerria Tanton L on: 07/19/2018 09:58 AM   Modules accepted: Orders

## 2018-07-19 NOTE — Progress Notes (Signed)
ROB-pt stated that she is having a lot of pressure in her vaginal area.

## 2018-07-20 DIAGNOSIS — Z8619 Personal history of other infectious and parasitic diseases: Secondary | ICD-10-CM

## 2018-07-20 HISTORY — DX: Personal history of other infectious and parasitic diseases: Z86.19

## 2018-07-21 ENCOUNTER — Encounter: Payer: Self-pay | Admitting: Obstetrics and Gynecology

## 2018-07-21 ENCOUNTER — Observation Stay
Admission: EM | Admit: 2018-07-21 | Discharge: 2018-07-21 | Disposition: A | Discharge status: Home / Self Care | Payer: 59 | Attending: Obstetrics and Gynecology | Admitting: Obstetrics and Gynecology

## 2018-07-21 ENCOUNTER — Other Ambulatory Visit: Payer: Self-pay

## 2018-07-21 DIAGNOSIS — Z3A36 36 weeks gestation of pregnancy: Secondary | ICD-10-CM

## 2018-07-21 DIAGNOSIS — Z3490 Encounter for supervision of normal pregnancy, unspecified, unspecified trimester: Secondary | ICD-10-CM

## 2018-07-21 DIAGNOSIS — R55 Syncope and collapse: Secondary | ICD-10-CM | POA: Diagnosis not present

## 2018-07-21 DIAGNOSIS — O34219 Maternal care for unspecified type scar from previous cesarean delivery: Secondary | ICD-10-CM | POA: Diagnosis not present

## 2018-07-21 DIAGNOSIS — O9989 Other specified diseases and conditions complicating pregnancy, childbirth and the puerperium: Principal | ICD-10-CM

## 2018-07-21 DIAGNOSIS — F1290 Cannabis use, unspecified, uncomplicated: Secondary | ICD-10-CM

## 2018-07-21 DIAGNOSIS — N83202 Unspecified ovarian cyst, left side: Secondary | ICD-10-CM

## 2018-07-21 LAB — GC/CHLAMYDIA PROBE AMP
Chlamydia trachomatis, NAA: NEGATIVE
NEISSERIA GONORRHOEAE BY PCR: NEGATIVE

## 2018-07-21 LAB — STREP GP B NAA: Strep Gp B NAA: NEGATIVE

## 2018-07-21 NOTE — Discharge Summary (Signed)
RN reviewed discharge instructions with patient. RN gave patient opportunity for questions. No questions asked at this time. Pt verbalized understanding of discharge instructions. Pt and patient's mother given work notes. Pt discharged home with her mother.

## 2018-07-21 NOTE — OB Triage Note (Signed)
Pt presents c/o low blood pressure while at work. Pt states she blacked out while sitting at her desk at work. Pt c/o her arms tingling. Pt states she has ate breakfast. Pt denies any pain, LOF, or bleeding. Reports positive fetal movement. Vitals WNL. Will continue to monitor.

## 2018-07-21 NOTE — Progress Notes (Signed)
RN to take patient off fetal monitors for discharge. Pt reassured that her blood pressures were normal and not low. They were actually higher today than they have been in office.  Pt reassured that her baby's tracing looked good. Pt was having an occasional mild contraction but nothing regular.  Pt's mother began to yell at RN about how she hated Dr. Valentino Saxonherry and "didn't know how she was a doctor". She said, "she doesn't care or have any empathy for Janayia. She has never had this with any of her pregnancies and neither have I with any of my children. Jordann is passive but I'm a "I will beat your ass" kind of person because this is my family you are messing with. She better hope I don't see her stupid ass cause I will choke a motherfucker! Stupid bitch! If anything happens to my daughter or her baby, you best believe she won't have a license anymore because this is not the first time she has told her about this going on and she hasn't done anything about it. There has to be an underlying problem! But whatever, I ain't mad at you hunnie. I'm just frustrated because they told her last time she was here if she had been 4 days more, they would take the baby. I don't understand why they don't just take the baby and make Naiah feel better". After the mother yelled for a good five minutes, She then requested a work note for herself and the patient. RN explained to patient and her mother that unless there is fetal distress, they normally won't take a baby earlier than 37 weeks due to fetal lung development. She said "well that's not what we were told last time we were here". RN informed doctor of the family member's outrage.

## 2018-07-23 NOTE — Final Progress Note (Addendum)
L&D OB Triage Note  Teresa Bond is a 32 y.o. [id number] female at 6174w3d, EDD Estimated Date of Delivery: 08/17/18 who presented to triage for complaints of syncopal episode at work today. She has a h/o prior C-section x 2, desiring repeat wi  Notes that she did not fall on her stomach.  Denies vagninal bleeding. She was evaluated by the nurses with no significant findings. Vital signs stable. An NST was performed and has been reviewed by MD.    Vitals:   07/21/18 1115 07/21/18 1121 07/21/18 1140  BP: 115/77 115/77 110/65  Pulse: 76 74 77  Resp:  20   Temp:  97.8 F (36.6 C)   TempSrc:  Oral   Weight:  152 lb 8 oz (69.2 kg)   Height:  5\' 4"  (1.626 m)      NST INTERPRETATION: Indications: patient reassurance  Mode: External Baseline Rate (A): 125 bpm Variability: Moderate Accelerations: 15 x 15 Decelerations: None     Contraction Frequency (min): occasional  Impression: reactive   Plan: NST performed was reviewed and was found to be reactive. She was discharged home with bleeding/labor precautions.  Continue routine prenatal care. Follow up with OB/GYN as previously scheduled.    Hildred Laserherry, Lavergne Hiltunen, MD [facility]     Hildred LaserAnika Kaytlin Burklow, MD [facility]

## 2018-07-26 ENCOUNTER — Encounter: Payer: Self-pay | Admitting: Obstetrics and Gynecology

## 2018-07-26 ENCOUNTER — Ambulatory Visit (INDEPENDENT_AMBULATORY_CARE_PROVIDER_SITE_OTHER): Payer: 59 | Admitting: Obstetrics and Gynecology

## 2018-07-26 VITALS — BP 108/73 | HR 69 | Wt 155.0 lb

## 2018-07-26 DIAGNOSIS — Z3483 Encounter for supervision of other normal pregnancy, third trimester: Secondary | ICD-10-CM | POA: Diagnosis not present

## 2018-07-26 LAB — POCT URINALYSIS DIPSTICK
Bilirubin, UA: NEGATIVE
Blood, UA: NEGATIVE
Glucose, UA: NEGATIVE
KETONES UA: NEGATIVE
Leukocytes, UA: NEGATIVE
Nitrite, UA: NEGATIVE
Protein, UA: NEGATIVE
Spec Grav, UA: 1.01 (ref 1.010–1.025)
UROBILINOGEN UA: 0.2 U/dL
pH, UA: 7.5 (ref 5.0–8.0)

## 2018-07-26 NOTE — Progress Notes (Signed)
Pt states no concerns at this time 

## 2018-07-26 NOTE — Progress Notes (Signed)
ROB: No complaints today.  No further lightheadedness.  Planning on cesarean delivery 8-23.

## 2018-08-02 ENCOUNTER — Ambulatory Visit (INDEPENDENT_AMBULATORY_CARE_PROVIDER_SITE_OTHER): Payer: 59 | Admitting: Obstetrics and Gynecology

## 2018-08-02 VITALS — BP 99/63 | HR 81 | Wt 158.2 lb

## 2018-08-02 DIAGNOSIS — Z3483 Encounter for supervision of other normal pregnancy, third trimester: Secondary | ICD-10-CM

## 2018-08-02 DIAGNOSIS — O34219 Maternal care for unspecified type scar from previous cesarean delivery: Secondary | ICD-10-CM

## 2018-08-02 DIAGNOSIS — Z98891 History of uterine scar from previous surgery: Secondary | ICD-10-CM

## 2018-08-02 LAB — POCT URINALYSIS DIPSTICK
Bilirubin, UA: NEGATIVE
Glucose, UA: NEGATIVE
Ketones, UA: NEGATIVE
LEUKOCYTES UA: NEGATIVE
Nitrite, UA: NEGATIVE
PH UA: 6 (ref 5.0–8.0)
Protein, UA: POSITIVE — AB
RBC UA: NEGATIVE
SPEC GRAV UA: 1.015 (ref 1.010–1.025)
Urobilinogen, UA: 0.2 E.U./dL

## 2018-08-02 NOTE — Progress Notes (Signed)
ROB: Doing well, no major complaints. Excited about C-section next week.  All questions answered.  RTC in 1 week.

## 2018-08-02 NOTE — Progress Notes (Signed)
ROB-Pt stated that she is doing well no complaints.  

## 2018-08-10 ENCOUNTER — Encounter: Payer: 59 | Admitting: Obstetrics and Gynecology

## 2018-08-10 ENCOUNTER — Encounter: Payer: Self-pay | Admitting: Obstetrics and Gynecology

## 2018-08-10 ENCOUNTER — Other Ambulatory Visit: Payer: Self-pay

## 2018-08-10 ENCOUNTER — Encounter
Admission: RE | Admit: 2018-08-10 | Discharge: 2018-08-10 | Disposition: A | Discharge status: Home / Self Care | Payer: 59 | Source: Ambulatory Visit | Attending: Obstetrics and Gynecology | Admitting: Obstetrics and Gynecology

## 2018-08-10 ENCOUNTER — Ambulatory Visit (INDEPENDENT_AMBULATORY_CARE_PROVIDER_SITE_OTHER): Payer: 59 | Admitting: Obstetrics and Gynecology

## 2018-08-10 VITALS — BP 124/77 | HR 72 | Wt 161.0 lb

## 2018-08-10 DIAGNOSIS — K219 Gastro-esophageal reflux disease without esophagitis: Secondary | ICD-10-CM

## 2018-08-10 DIAGNOSIS — Z3483 Encounter for supervision of other normal pregnancy, third trimester: Secondary | ICD-10-CM

## 2018-08-10 DIAGNOSIS — D649 Anemia, unspecified: Secondary | ICD-10-CM

## 2018-08-10 DIAGNOSIS — N9089 Other specified noninflammatory disorders of vulva and perineum: Secondary | ICD-10-CM

## 2018-08-10 HISTORY — DX: Personal history of other infectious and parasitic diseases: Z86.19

## 2018-08-10 HISTORY — DX: Anemia, unspecified: D64.9

## 2018-08-10 HISTORY — DX: Personal history of urinary calculi: Z87.442

## 2018-08-10 HISTORY — DX: Gastro-esophageal reflux disease without esophagitis: K21.9

## 2018-08-10 LAB — TYPE AND SCREEN
ABO/RH(D): A POS
ANTIBODY SCREEN: NEGATIVE
Extend sample reason: UNDETERMINED

## 2018-08-10 LAB — POCT URINALYSIS DIPSTICK OB
BILIRUBIN UA: NEGATIVE
Blood, UA: NEGATIVE
GLUCOSE, UA: NEGATIVE — AB
Ketones, UA: NEGATIVE
Leukocytes, UA: NEGATIVE
Nitrite, UA: NEGATIVE
PH UA: 7 (ref 5.0–8.0)
POC,PROTEIN,UA: NEGATIVE
Spec Grav, UA: 1.01 (ref 1.010–1.025)
UROBILINOGEN UA: 0.2 U/dL

## 2018-08-10 MED ORDER — DEXTROSE 5 % IV SOLN
3.0000 g | INTRAVENOUS | Status: DC
  Filled 2018-08-10: qty 3000

## 2018-08-10 NOTE — Progress Notes (Signed)
Pt presents today for routine prenatal care before scheduled c-section. Pt has no concerns at this time.

## 2018-08-10 NOTE — H&P (Signed)
    History and Physical   HPI  Teresa Bond is a 32 y.o. G5P2022 at [redacted]w[redacted]d Estimated Date of Delivery: 08/17/18 who is being admitted for  C-section repeat and tubal occlusion.    OB History  OB History  Gravida Para Term Preterm AB Living  5 2 2 0 2 2  SAB TAB Ectopic Multiple Live Births  2 0 0 0 2    # Outcome Date GA Lbr Len/2nd Weight Sex Delivery Anes PTL Lv  5 Current           4 SAB 2016          3 SAB 2016          2 Term 2015    M CS-Unspec   LIV  1 Term 2011    F CS-Unspec   LIV    PROBLEM LIST  Pregnancy complications or risks: Patient Active Problem List   Diagnosis Date Noted  . Preterm uterine contractions in third trimester, antepartum 07/09/2018  . Pregnancy 07/08/2018  . Anemia of pregnancy in third trimester 06/27/2018  . Left ovarian cyst 04/11/2018  . 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 Previous cesarean section 02/06/2018  . Smoker 02/06/2018  . Rubella non-immune status, antepartum 02/06/2018     Prenatal labs and studies: ABO, Rh: --/--/PENDING (08/22 1039) Antibody: PENDING (08/22 1039) Rubella: <0.90 (02/01 1452) RPR: Non Reactive (06/25 0810)  HBsAg: Negative (02/01 1452)  HIV: Non Reactive (02/01 1452)  GBS:Negative (07/31 1129)   Past Medical History:  Diagnosis Date  . Anemia 08/10/2018   borderline during pregnancy.  . GERD (gastroesophageal reflux disease) 08/10/2018   terrible during this pregnancy. does not take anything for it.  . 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 History of kidney stones      Past Surgical History:  Procedure Laterality Date  . CESAREAN SECTION       Medications    Patient's Medications  New Prescriptions   No medications on file  Previous Medications   PRENATAL VIT-FE FUMARATE-FA (PRENATAL MULTIVITAMIN) TABS TABLET    Take 1 tablet by mouth daily at 12 noon.  Modified Medications   No medications on file  Discontinued Medications   No medications on file     Allergies  Patient has  no known allergies.  Review of Systems  Pertinent items are noted in HPI.  Physical Exam  BP 124/77   Pulse 72   Wt 161 lb (73 kg)   LMP 11/09/2017   BMI 27.64 kg/m   Lungs:  CTA B Cardio: RRR without M/R/G Abd: Soft, gravid, NT Presentation: cephalic EXT: No C/C/ 1+ Edema DTRs: 2+ B CERVIX:  NA   See Prenatal records for more detailed PE.     Test Results  Results for orders placed or performed in visit on 08/10/18 (from the past 24 hour(s))  POC Urinalysis Dipstick OB     Status: Abnormal   Collection Time: 08/10/18 11:28 AM  Result Value Ref Range   Color, UA yellow    Clarity, UA clear    Glucose, UA Negative (A) (none)   Bilirubin, UA negative    Ketones, UA negative    Spec Grav, UA 1.010 1.010 - 1.025   Blood, UA negative    pH, UA 7.0 5.0 - 8.0   POC Protein UA Negative Negative, Trace   Urobilinogen, UA 0.2 0.2 or 1.0 E.U./dL   Nitrite, UA negative    Leukocytes, UA Negative Negative     Appearance yellow    Odor none      Assessment   G5P2022 at [redacted]w[redacted]d Estimated Date of Delivery: 08/17/18  The fetus is reassuring.    Patient Active Problem List   Diagnosis Date Noted  . Preterm uterine contractions in third trimester, antepartum 07/09/2018  . Pregnancy 07/08/2018  . Anemia of pregnancy in third trimester 06/27/2018  . Left ovarian cyst 04/11/2018  . 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 Previous cesarean section 02/06/2018  . Smoker 02/06/2018  . Rubella non-immune status, antepartum 02/06/2018    Plan  1. Admit to L&D  2. Admission labs  3. Prep for CD  David J. Evans, M.D. 08/10/2018 1:39 PM 

## 2018-08-10 NOTE — Progress Notes (Signed)
ROB:  CD discussed.  Pt with c/o vulvar "bump" X 1 weeks.  Open lesion noted - non-tender.  HSV cx done.  Pt desires tubal with CD.

## 2018-08-10 NOTE — H&P (View-Only) (Signed)
History and Physical   HPI  Teresa Bond is a 32 y.o. [id number] at 6416w0d Estimated Date of Delivery: 08/17/18 who is being admitted for  C-section repeat and tubal occlusion.    OB History  OB History  Gravida Para Term Preterm AB Living  5 2 2  0 2 2  SAB TAB Ectopic Multiple Live Births  2 0 0 0 2    # Outcome Date GA Lbr Len/2nd Weight Sex Delivery Anes PTL Lv  5 Current           4 SAB 2016          3 SAB 2016          2 Term 2015    M CS-Unspec   LIV  1 Term 2011    F CS-Unspec   LIV    PROBLEM LIST  Pregnancy complications or risks: Patient Active Problem List   Diagnosis Date Noted  . Preterm uterine contractions in third trimester, antepartum 07/09/2018  . Pregnancy 07/08/2018  . Anemia of pregnancy in third trimester 06/27/2018  . Left ovarian cyst 04/11/2018  . 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 Previous cesarean section 02/06/2018  . Smoker 02/06/2018  . Rubella non-immune status, antepartum 02/06/2018     Prenatal labs and studies: ABO, Rh: --/--/PENDING (08/22 1039) Antibody: PENDING (08/22 1039) Rubella: <0.90 (02/01 1452) RPR: Non Reactive (06/25 0810)  HBsAg: Negative (02/01 1452)  HIV: Non Reactive (02/01 1452)  JYN:WGNFAOZHGBS:Negative (07/31 1129)   Past Medical History:  Diagnosis Date  . Anemia 08/10/2018   borderline during pregnancy.  Marland Kitchen. GERD (gastroesophageal reflux disease) 08/10/2018   terrible during this pregnancy. does not take anything for it.  Marland Kitchen. 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 History of kidney stones      Past Surgical History:  Procedure Laterality Date  . CESAREAN SECTION       Medications    Patient's Medications  New Prescriptions   No medications on file  Previous Medications   PRENATAL VIT-FE FUMARATE-FA (PRENATAL MULTIVITAMIN) TABS TABLET    Take 1 tablet by mouth daily at 12 noon.  Modified Medications   No medications on file  Discontinued Medications   No medications on file     Allergies  Patient has  no known allergies.  Review of Systems  Pertinent items are noted in HPI.  Physical Exam  BP 124/77   Pulse 72   Wt 161 lb (73 kg)   LMP 11/09/2017   BMI 27.64 kg/m   Lungs:  CTA B Cardio: RRR without M/R/G Abd: Soft, gravid, NT Presentation: cephalic EXT: No C/C/ 1+ Edema DTRs: 2+ B CERVIX:  NA   See Prenatal records for more detailed PE.     Test Results  Results for orders placed or performed in visit on 08/10/18 (from the past 24 hour(s))  POC Urinalysis Dipstick OB     Status: Abnormal   Collection Time: 08/10/18 11:28 AM  Result Value Ref Range   Color, UA yellow    Clarity, UA clear    Glucose, UA Negative (A) (none)   Bilirubin, UA negative    Ketones, UA negative    Spec Grav, UA 1.010 1.010 - 1.025   Blood, UA negative    pH, UA 7.0 5.0 - 8.0   POC Protein UA Negative Negative, Trace   Urobilinogen, UA 0.2 0.2 or 1.0 E.U./dL   Nitrite, UA negative    Leukocytes, UA Negative Negative  Appearance yellow    Odor none      Assessment   G5P2022 at [redacted]w[redacted]d Estimated Date of Delivery: 08/17/18  The fetus is reassuring.    Patient Active Problem List   Diagnosis Date Noted  . Preterm uterine contractions in third trimester, antepartum 07/09/2018  . Pregnancy 07/08/2018  . Anemia of pregnancy in third trimester 06/27/2018  . Left ovarian cyst 04/11/2018  . 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 Previous cesarean section 02/06/2018  . Smoker 02/06/2018  . Rubella non-immune status, antepartum 02/06/2018    Plan  1. Admit to L&D  2. Admission labs  3. Prep for CD  Elonda Husky, M.D. 08/10/2018 1:39 PM

## 2018-08-10 NOTE — Patient Instructions (Addendum)
Your procedure is scheduled on: Thursday, August 11, 2018  Report to THE EMERGENCY ROOM AT 5:30 TO REGISTER AND THEN     GO TO LABOR AND DELIVERY   Remember: Instructions that are not followed completely may result in serious medical risk,  up to and including death, or upon the discretion of your surgeon and anesthesiologist your  surgery may need to be rescheduled.     _X__ 1. Do not eat food after midnight the night before your procedure.                 No gum chewing or hard candies. NO TIC TACS, LOZENGERS, TUMS OR ANYTHING                  SOLID IN YOUR MOUTH AFTER MIDNIGHT.                  You may drink clear liquids up to 2 hours before you are scheduled to arrive for your surgery-                   DO not drink clear liquids within 2 hours of the start of your surgery.                  Clear Liquids include:  water, apple juice without pulp, clear carbohydrate                 drink such as Clearfast of Gatorade, Black Coffee or Tea (Do not add                 anything to coffee or tea).  __X__2.  On the morning of surgery brush your teeth with toothpaste and water,                 You may rinse your mouth with mouthwash if you wish.                      Do not swallow any toothpaste of mouthwash.     _X__ 3.  No Alcohol for 24 hours before or after surgery.   _X__ 4.  Do Not Smoke or use e-cigarettes For 24 Hours Prior to Your Surgery.                 Do not use any chewable tobacco products for at least 6 hours prior to                 surgery.  ____  5.  Bring all medications with you on the day of surgery if instructed.   ____  6.  Notify your doctor if there is any change in your medical condition      (cold, fever, infections).     Do not wear jewelry, make-up, hairpins, clips or nail polish. Do not wear lotions, powders, or perfumes. You may wear deodorant. Do not shave 48 hours prior to surgery. Men may shave face and neck. Do not bring  valuables to the hospital.    [facility] is not responsible for any belongings or valuables.  Contacts, dentures or bridgework may not be worn into surgery. Leave your suitcase in the car. After surgery it may be brought to your room. For patients admitted to the hospital, discharge time is determined by your treatment team.   Patients discharged the day of surgery will not be allowed to drive home.   Please read over the following fact sheets that  you were given:   PREPARING FOR SURGERY   ____ Take these medicines the morning of surgery with A SIP OF WATER:    1. none  2.   3.   4.  5.  6.  ____ Fleet Enema (as directed)   __x__ Use CHG Soap as directed  __X_  Stop ASPIRIN PRODUCTS TODAY  __X__ Stop Anti-inflammatories TODAY  __X__ Stop supplements until after surgery.                RESUME PRENATAL VITAMINS AFTER SURGERY  ____ Bring C-Pap to the hospital.   WEAR COMFORTABLE CLOTHES TO THE HOSPITAL.  HAVE STURDY SHOES FOR THE HOSPITAL.  HAVE STOOL SOFTENERS AT HOME TO USE AFTER DELIVERY.  HAVE A PILLOW AT HOME TO SUPPORT YOUR BELLY.  DRINK LOTS OF FLUIDS AFTER SURGERY.

## 2018-08-11 ENCOUNTER — Other Ambulatory Visit: Payer: Self-pay

## 2018-08-11 ENCOUNTER — Inpatient Hospital Stay
Admission: RE | Admit: 2018-08-11 | Discharge: 2018-08-12 | DRG: 784 | Disposition: A | Discharge status: Home / Self Care | Payer: 59 | Attending: Obstetrics and Gynecology | Admitting: Obstetrics and Gynecology

## 2018-08-11 ENCOUNTER — Encounter
Admission: RE | Disposition: A | Discharge status: Home / Self Care | Payer: Self-pay | Source: Home / Self Care | Attending: Obstetrics and Gynecology

## 2018-08-11 ENCOUNTER — Inpatient Hospital Stay: Payer: 59 | Admitting: Anesthesiology

## 2018-08-11 DIAGNOSIS — Z302 Encounter for sterilization: Secondary | ICD-10-CM

## 2018-08-11 DIAGNOSIS — O3483 Maternal care for other abnormalities of pelvic organs, third trimester: Secondary | ICD-10-CM | POA: Diagnosis present

## 2018-08-11 DIAGNOSIS — O99324 Drug use complicating childbirth: Secondary | ICD-10-CM | POA: Diagnosis present

## 2018-08-11 DIAGNOSIS — O9962 Diseases of the digestive system complicating childbirth: Secondary | ICD-10-CM | POA: Diagnosis present

## 2018-08-11 DIAGNOSIS — O9902 Anemia complicating childbirth: Secondary | ICD-10-CM | POA: Diagnosis present

## 2018-08-11 DIAGNOSIS — Z3A39 39 weeks gestation of pregnancy: Secondary | ICD-10-CM

## 2018-08-11 DIAGNOSIS — O34211 Maternal care for low transverse scar from previous cesarean delivery: Secondary | ICD-10-CM | POA: Diagnosis present

## 2018-08-11 DIAGNOSIS — F1290 Cannabis use, unspecified, uncomplicated: Secondary | ICD-10-CM | POA: Diagnosis present

## 2018-08-11 DIAGNOSIS — K219 Gastro-esophageal reflux disease without esophagitis: Secondary | ICD-10-CM | POA: Diagnosis present

## 2018-08-11 DIAGNOSIS — D649 Anemia, unspecified: Secondary | ICD-10-CM | POA: Diagnosis present

## 2018-08-11 DIAGNOSIS — O99334 Smoking (tobacco) complicating childbirth: Secondary | ICD-10-CM | POA: Diagnosis present

## 2018-08-11 DIAGNOSIS — F17200 Nicotine dependence, unspecified, uncomplicated: Secondary | ICD-10-CM | POA: Diagnosis present

## 2018-08-11 DIAGNOSIS — O34219 Maternal care for unspecified type scar from previous cesarean delivery: Secondary | ICD-10-CM

## 2018-08-11 DIAGNOSIS — N83202 Unspecified ovarian cyst, left side: Secondary | ICD-10-CM | POA: Diagnosis present

## 2018-08-11 LAB — CBC
HEMATOCRIT: 33.9 % — AB (ref 35.0–47.0)
HEMOGLOBIN: 11.6 g/dL — AB (ref 12.0–16.0)
MCH: 30.6 pg (ref 26.0–34.0)
MCHC: 34.2 g/dL (ref 32.0–36.0)
MCV: 89.3 fL (ref 80.0–100.0)
Platelets: 237 10*3/uL (ref 150–440)
RBC: 3.79 MIL/uL — AB (ref 3.80–5.20)
RDW: 14.6 % — ABNORMAL HIGH (ref 11.5–14.5)
WBC: 9.4 10*3/uL (ref 3.6–11.0)

## 2018-08-11 LAB — HCG, QUANTITATIVE, PREGNANCY: hCG, Beta Chain, Quant, S: 66664 m[IU]/mL — ABNORMAL HIGH (ref <5)

## 2018-08-11 SURGERY — Surgical Case
Anesthesia: Spinal | Site: Abdomen | Wound class: Clean Contaminated

## 2018-08-11 MED ORDER — LIDOCAINE HCL (PF) 1 % IJ SOLN
INTRAMUSCULAR | Status: DC | PRN
  Administered 2018-08-11: 2 mL via SUBCUTANEOUS

## 2018-08-11 MED ORDER — ACETAMINOPHEN 325 MG PO TABS: 650.0000 mg | ORAL_TABLET | ORAL | Status: DC | PRN

## 2018-08-11 MED ORDER — LACTATED RINGERS IV SOLN
INTRAVENOUS | Status: DC
  Administered 2018-08-12: 01:00:00 via INTRAVENOUS

## 2018-08-11 MED ORDER — IBUPROFEN 600 MG PO TABS
600.0000 mg | ORAL_TABLET | Freq: Four times a day (QID) | ORAL | Status: DC
  Administered 2018-08-11 – 2018-08-12 (×5): 600 mg via ORAL
  Filled 2018-08-11 (×5): qty 1

## 2018-08-11 MED ORDER — LIDOCAINE HCL (PF) 1 % IJ SOLN
INTRAMUSCULAR | Status: AC
  Filled 2018-08-11: qty 30

## 2018-08-11 MED ORDER — CEFAZOLIN SODIUM-DEXTROSE 2-4 GM/100ML-% IV SOLN
2.0000 g | INTRAVENOUS | Status: DC
  Filled 2018-08-11: qty 100

## 2018-08-11 MED ORDER — PHENYLEPHRINE HCL 10 MG/ML IJ SOLN
INTRAMUSCULAR | Status: DC | PRN
  Administered 2018-08-11 (×2): 100 ug via INTRAVENOUS

## 2018-08-11 MED ORDER — FENTANYL CITRATE (PF) 100 MCG/2ML IJ SOLN
INTRAMUSCULAR | Status: DC | PRN
  Administered 2018-08-11: 15 ug via INTRATHECAL

## 2018-08-11 MED ORDER — LIDOCAINE 5 % EX PTCH
MEDICATED_PATCH | CUTANEOUS | Status: AC
  Filled 2018-08-11: qty 1

## 2018-08-11 MED ORDER — SOD CITRATE-CITRIC ACID 500-334 MG/5ML PO SOLN
30.0000 mL | Freq: Once | ORAL | Status: AC
  Administered 2018-08-11: 30 mL via ORAL

## 2018-08-11 MED ORDER — AMMONIA AROMATIC IN INHA
RESPIRATORY_TRACT | Status: AC
  Filled 2018-08-11: qty 10

## 2018-08-11 MED ORDER — OXYTOCIN 40 UNITS IN LACTATED RINGERS INFUSION - SIMPLE MED
INTRAVENOUS | Status: DC | PRN
  Administered 2018-08-11: 1000 mL via INTRAVENOUS

## 2018-08-11 MED ORDER — OXYCODONE-ACETAMINOPHEN 5-325 MG PO TABS: 1.0000 | ORAL_TABLET | ORAL | Status: DC | PRN

## 2018-08-11 MED ORDER — MENTHOL 3 MG MT LOZG
1.0000 | LOZENGE | OROMUCOSAL | Status: DC | PRN
  Filled 2018-08-11: qty 9

## 2018-08-11 MED ORDER — ONDANSETRON HCL 4 MG/2ML IJ SOLN
INTRAMUSCULAR | Status: DC | PRN
  Administered 2018-08-11: 4 mg via INTRAVENOUS

## 2018-08-11 MED ORDER — PHENYLEPHRINE 40 MCG/ML (10ML) SYRINGE FOR IV PUSH (FOR BLOOD PRESSURE SUPPORT): PREFILLED_SYRINGE | INTRAVENOUS | Status: DC | PRN

## 2018-08-11 MED ORDER — OXYTOCIN 40 UNITS IN LACTATED RINGERS INFUSION - SIMPLE MED
INTRAVENOUS | Status: AC
  Filled 2018-08-11: qty 1000

## 2018-08-11 MED ORDER — MORPHINE SULFATE (PF) 0.5 MG/ML IJ SOLN
INTRAMUSCULAR | Status: AC
  Filled 2018-08-11: qty 10

## 2018-08-11 MED ORDER — SIMETHICONE 80 MG PO CHEW
80.0000 mg | CHEWABLE_TABLET | Freq: Four times a day (QID) | ORAL | Status: DC
  Administered 2018-08-11 – 2018-08-12 (×5): 80 mg via ORAL
  Filled 2018-08-11 (×5): qty 1

## 2018-08-11 MED ORDER — DIPHENHYDRAMINE HCL 25 MG PO CAPS: 25.0000 mg | ORAL_CAPSULE | Freq: Four times a day (QID) | ORAL | Status: DC | PRN

## 2018-08-11 MED ORDER — PRENATAL MULTIVITAMIN CH
1.0000 | ORAL_TABLET | Freq: Every day | ORAL | Status: DC
  Administered 2018-08-11 – 2018-08-12 (×2): 1 via ORAL
  Filled 2018-08-11 (×2): qty 1

## 2018-08-11 MED ORDER — OXYTOCIN 40 UNITS IN LACTATED RINGERS INFUSION - SIMPLE MED
2.5000 [IU]/h | INTRAVENOUS | Status: AC
  Filled 2018-08-11: qty 1000

## 2018-08-11 MED ORDER — SOD CITRATE-CITRIC ACID 500-334 MG/5ML PO SOLN
ORAL | Status: AC
  Administered 2018-08-11: 30 mL via ORAL
  Filled 2018-08-11: qty 15

## 2018-08-11 MED ORDER — SENNOSIDES-DOCUSATE SODIUM 8.6-50 MG PO TABS
2.0000 | ORAL_TABLET | ORAL | Status: DC
  Administered 2018-08-12: 2 via ORAL
  Filled 2018-08-11 (×2): qty 2

## 2018-08-11 MED ORDER — ONDANSETRON HCL 4 MG/2ML IJ SOLN
INTRAMUSCULAR | Status: AC
  Filled 2018-08-11: qty 2

## 2018-08-11 MED ORDER — MORPHINE SULFATE (PF) 0.5 MG/ML IJ SOLN
INTRAMUSCULAR | Status: DC | PRN
  Administered 2018-08-11: .1 mg via INTRATHECAL

## 2018-08-11 MED ORDER — PHENYLEPHRINE HCL 10 MG/ML IJ SOLN
INTRAMUSCULAR | Status: AC
  Filled 2018-08-11: qty 1

## 2018-08-11 MED ORDER — SODIUM CHLORIDE 0.9 % IV SOLN
INTRAVENOUS | Status: DC | PRN
  Administered 2018-08-11: 20 ug/min via INTRAVENOUS

## 2018-08-11 MED ORDER — BUPIVACAINE IN DEXTROSE 0.75-8.25 % IT SOLN
INTRATHECAL | Status: DC | PRN
  Administered 2018-08-11: 1.6 mL via INTRATHECAL

## 2018-08-11 MED ORDER — FENTANYL CITRATE (PF) 100 MCG/2ML IJ SOLN
INTRAMUSCULAR | Status: AC
  Filled 2018-08-11: qty 2

## 2018-08-11 MED ORDER — OXYTOCIN 10 UNIT/ML IJ SOLN
INTRAMUSCULAR | Status: AC
  Filled 2018-08-11: qty 2

## 2018-08-11 MED ORDER — LACTATED RINGERS IV SOLN
INTRAVENOUS | Status: DC
  Administered 2018-08-11: 1000 mL via INTRAVENOUS
  Administered 2018-08-11: 08:00:00 via INTRAVENOUS

## 2018-08-11 MED ORDER — ZOLPIDEM TARTRATE 5 MG PO TABS: 5.0000 mg | ORAL_TABLET | Freq: Every evening | ORAL | Status: DC | PRN

## 2018-08-11 MED ORDER — MISOPROSTOL 200 MCG PO TABS
ORAL_TABLET | ORAL | Status: AC
  Filled 2018-08-11: qty 4

## 2018-08-11 MED ORDER — OXYCODONE-ACETAMINOPHEN 5-325 MG PO TABS: 2.0000 | ORAL_TABLET | ORAL | Status: DC | PRN

## 2018-08-11 SURGICAL SUPPLY — 23 items
ADHESIVE MASTISOL STRL (MISCELLANEOUS) ×3 IMPLANT
BAG COUNTER SPONGE EZ (MISCELLANEOUS) ×2 IMPLANT
CANISTER SUCT 3000ML PPV (MISCELLANEOUS) ×3 IMPLANT
CHLORAPREP W/TINT 26ML (MISCELLANEOUS) ×6 IMPLANT
COUNTER SPONGE BAG EZ (MISCELLANEOUS) ×1
DRSG TELFA 3X8 NADH (GAUZE/BANDAGES/DRESSINGS) ×3 IMPLANT
GAUZE SPONGE 4X4 12PLY STRL (GAUZE/BANDAGES/DRESSINGS) ×3 IMPLANT
GLOVE BIOGEL PI ORTHO PRO 7.5 (GLOVE) ×2
GLOVE PI ORTHO PRO STRL 7.5 (GLOVE) ×1 IMPLANT
GOWN STRL REUS W/ TWL LRG LVL3 (GOWN DISPOSABLE) ×2 IMPLANT
GOWN STRL REUS W/TWL LRG LVL3 (GOWN DISPOSABLE) ×4
KIT TURNOVER KIT A (KITS) ×3 IMPLANT
NS IRRIG 1000ML POUR BTL (IV SOLUTION) ×3 IMPLANT
PACK C SECTION AR (MISCELLANEOUS) ×3 IMPLANT
PAD OB MATERNITY 4.3X12.25 (PERSONAL CARE ITEMS) ×3 IMPLANT
PAD PREP 24X41 OB/GYN DISP (PERSONAL CARE ITEMS) ×3 IMPLANT
RETRACTOR WND ALEXIS-O 25 LRG (MISCELLANEOUS) ×1 IMPLANT
RTRCTR WOUND ALEXIS O 25CM LRG (MISCELLANEOUS) ×3
SPONGE LAP 18X18 RF (DISPOSABLE) ×3 IMPLANT
SUT VIC AB 0 CTX 36 (SUTURE) ×4
SUT VIC AB 0 CTX36XBRD ANBCTRL (SUTURE) ×2 IMPLANT
SUT VIC AB 1 CT1 36 (SUTURE) ×6 IMPLANT
SUT VICRYL+ 3-0 36IN CT-1 (SUTURE) ×6 IMPLANT

## 2018-08-11 NOTE — Progress Notes (Signed)
Patient refused to keep on SCDs at this time. Patient educated and encouraged to wear. Notified Debbe OdeaLatisha who is her primary Charity fundraiserN.

## 2018-08-11 NOTE — Lactation Note (Signed)
This note was copied from a baby's chart. Lactation Consultation Note  Patient Name: Teresa Bond Today's Date: 08/11/2018 Reason for consult: Initial assessment   Maternal Data Formula Feeding for Exclusion: No Does the patient have breastfeeding experience prior to this delivery?: Yes Breastfed first child x 1 yr Feeding Feeding Type: Breast Fed Length of feed: 15 min(per mom)  LATCH Score Latch: Grasps breast easily, tongue down, lips flanged, rhythmical sucking.  Audible Swallowing: Spontaneous and intermittent  Type of Nipple: Everted at rest and after stimulation  Comfort (Breast/Nipple): Soft / non-tender  Hold (Positioning): No assistance needed to correctly position infant at breast.  LATCH Score: 10  Interventions Interventions: Breast feeding basics reviewed  Lactation Tools Discussed/Used WIC Program: No   Consult Status Consult Status: Follow-up Date: 08/12/18 Follow-up type: In-patient    Dyann KiefMarsha D Helma Argyle 08/11/2018, 5:39 PM

## 2018-08-11 NOTE — Anesthesia Procedure Notes (Deleted)
Performed by: Easter Schinke, CRNA       

## 2018-08-11 NOTE — Anesthesia Post-op Follow-up Note (Signed)
Anesthesia QCDR form completed.        

## 2018-08-11 NOTE — Anesthesia Procedure Notes (Addendum)
Date/Time: 08/11/2018 8:02 AM Performed by: Henrietta HooverPope, Lollie Gunner, CRNA Pre-anesthesia Checklist: Patient identified, Emergency Drugs available, Suction available, Patient being monitored and Timeout performed Oxygen Delivery Method: Nasal cannula Placement Confirmation: positive ETCO2

## 2018-08-11 NOTE — Anesthesia Procedure Notes (Cosign Needed Addendum)
Spinal  Patient location during procedure: OR Start time: 08/11/2018 8:02 AM End time: 08/11/2018 8:02 AM Staffing Anesthesiologist: Jovita GammaFitzgerald, Kathryn L, MD Resident/CRNA: Henrietta HooverPope, Surah Pelley, CRNA Other anesthesia staff: Uvaldo RisingKemmling, Aaron L, RN Performed: other anesthesia staff  Preanesthetic Checklist Completed: patient identified, site marked, surgical consent, pre-op evaluation, timeout performed, IV checked, risks and benefits discussed and monitors and equipment checked Spinal Block Patient position: sitting Prep: ChloraPrep Patient monitoring: heart rate, continuous pulse ox and blood pressure Approach: midline Location: L3-4 Injection technique: single-shot Needle Needle type: Pencan  Needle gauge: 24 G Needle length: 10 cm Assessment Sensory level: T6

## 2018-08-11 NOTE — Transfer of Care (Signed)
Immediate Anesthesia Transfer of Care Note  Patient: Teresa Bond  Procedure(s) Performed: CESAREAN SECTION WITH BILATERAL TUBAL LIGATION (N/A Abdomen)  Patient Location: Mother/Baby  Anesthesia Type:Spinal  Level of Consciousness: awake, alert  and oriented  Airway & Oxygen Therapy: Patient Spontanous Breathing  Post-op Assessment: Report given to RN and Post -op Vital signs reviewed and stable  Post vital signs: Reviewed and stable  Last Vitals:  Vitals Value Taken Time  BP    Temp    Pulse    Resp    SpO2      Last Pain:  Vitals:   08/11/18 0618  TempSrc: Oral  PainSc: 0-No pain         Complications: No apparent anesthesia complications

## 2018-08-11 NOTE — Op Note (Signed)
OP NOTE  Date: 08/11/2018   9:39 AM Name Teresa Bond MR# [id number]  Preoperative Diagnosis: 1. Intrauterine pregnancy at 7727w1d 2. Desires Permanent Sterilization 3. Left Ovarian Cyst (?Teratoma)  Postoperative Diagnosis: 1. Intrauterine pregnancy at 2827w1d, delivered 2. Desires Permanent Sterilization 3. Viable infant 4.  Left ovarian cyst  Procedure: 1.  Repeat low-Transverse Cesarean Section 2.  Bilateral Tubal Occlusion 3.  Left ovarian cystectomy  Surgeon: Elonda Huskyavid J. Quame Spratlin, MD  Assistant:  Jeralyn BennettLawhorn, CNM  Anesthesia: Spinal   EBL: 600  ml    Findings: 1) female infant, Apgar scores of 9    at 1 minute and 10    at 5 minutes and a birthweight of 108.64  ounces.    2) Normal uterus, tubes and ovaries.   Procedure:   The patient was prepped and draped in the supine position and placed under spinal anesthesia.  A transverse incision was made across the abdomen in a Pfannenstiel manner. If indicated the old scar was systematically removed with sharp dissection.  We carried the dissection down to the level of the fascia.  The fascia was incised in a curvilinear manner.  The fascia was then elevated from the rectus muscles with blunt and sharp dissection.  The rectus muscles were separated laterally exposing the peritoneum.  The peritoneum was carefully entered with care being taken to avoid bowel and bladder.  A self-retaining retractor was placed.  The visceral peritoneum was incised in a curvilinear fashion across the lower uterine segment creating a bladder flap. A transverse incision was made across the lower uterine segment and extended laterally and superiorly using the bandage scissors.  Artificial rupture membranes was performed and Clear fluid was noted.  The infant was delivered from the cephalic position.  A nuchal cord was not present. The cord was doubly clamped and cut. Cord blood was obtained if appropriate.  The infant was handed to the pediatric personnel  who  then placed the infant under heat lamps where it was cleaned dried and re-suctioned. The placenta was delivered. The hysterotomy incision was then identified on ring forceps.  The uterine cavity was cleaned with a moist lap sponge.  The hysterotomy incision was closed with a running interlocking suture of Vicryl.  Hemostasis was excellent.  Pitocin was run in the IV and the uterus was found to be firm. The left ovarian cyst was identified and found to be occupying mostly the medial aspect of the ovary.  Normal ovarian tissue was able to be identified more laterally.  The cyst was systematically and carefully dissected out of the ovary and removed intact.  Using 3-0 Vicryl suture the area was carefully oversewn followed by an imbricating suture.  Hemostasis was noted. The fallopian tubes were identified  and followed out to their fine fimbriated ends and then back to the mid-portion of the tube which was elevated on a babcock clamp.  Both tubes were completely occluded using Filshie clips in a perpendicular manner.  Hemostasis was noted. The posterior cul-de-sac and gutters were cleaned and inspected.  Hemostasis was noted.  The fascia was then closed with a running suture of #1 Vicryl.  Hemostasis of the subcutaneous tissues was obtained using the Bovie.  The subcutaneous tissues were closed with a running suture of 000 Vicryl.  A subcuticular suture was placed.  Steri-Strips were applied in the usual manner.  A pressure dressing was placed.  The patient went to the recovery room in stable condition.  Elonda Husky, M.D. 08/11/2018 9:39 AM

## 2018-08-11 NOTE — Interval H&P Note (Signed)
History and Physical Interval Note:  08/11/2018 7:46 AM  Teresa Bond  has presented today for surgery, with the diagnosis of PRIOR C SECTION X 2,  The various methods of treatment have been discussed with the patient and family. After consideration of risks, benefits and other options for treatment, the patient has consented to  Procedure(s): CESAREAN SECTION WITH BILATERAL TUBAL LIGATION (N/A) as a surgical intervention .  The patient's history has been reviewed, patient examined, no change in status, stable for surgery.  I have reviewed the patient's chart and labs.  Questions were answered to the patient's satisfaction.     Brennan Baileyavid Evans

## 2018-08-12 NOTE — Progress Notes (Signed)
Discharge instructions given. Patient verbalizes understanding of teaching. Patient discharged home via wheelchair at 1440. 

## 2018-08-12 NOTE — Anesthesia Postprocedure Evaluation (Signed)
Anesthesia Post Note  Patient: Teresa Bond  Procedure(s) Performed: CESAREAN SECTION WITH BILATERAL TUBAL LIGATION (N/A Abdomen)  Patient location during evaluation: Mother Baby Anesthesia Type: Epidural Level of consciousness: awake and alert Pain management: pain level controlled Vital Signs Assessment: post-procedure vital signs reviewed and stable Respiratory status: spontaneous breathing, nonlabored ventilation and respiratory function stable Cardiovascular status: stable Postop Assessment: no headache, no backache and epidural receding Anesthetic complications: no     Last Vitals:  Vitals:   08/12/18 0417 08/12/18 0900  BP: 106/68 101/78  Pulse: 78 69  Resp: 18 18  Temp: 36.6 C 36.7 C  SpO2: 99% 99%    Last Pain:  Vitals:   08/12/18 0900  TempSrc: Oral  PainSc:                  Yevette EdwardsJames G Adams

## 2018-08-12 NOTE — Discharge Summary (Signed)
    Physician Obstetric Discharge Summary  Patient ID: Teresa Bond MRN: [id number] DOB/AGE: 32/12/1985 32 y.o.   Date of Admission: 08/11/2018  Date of Discharge: 08/12/2018  Admitting Diagnosis: Scheduled cesarean section at 571w2d   Mode of Delivery: repeat cesarean section            Discharge Diagnosis: No other diagnosis   Intrapartum Procedures: tubal ligation   Post partum procedures:   Complications: none                        Discharge Day SOAP Note:  Subjective:  The patient has no complaints.  She is ambulating well. She is taking PO well. Pain is well controlled with current medications. Patient is urinating without difficulty.   She is passing flatus.  She is specifically asking to go home today.  Objective  Vital signs in last 24 hours: BP 101/78 (BP Location: Left Arm)   Pulse 69   Temp 98.1 F (36.7 C) (Oral)   Resp 18   Ht 5\' 4"  (1.626 m)   Wt 73 kg   LMP 11/09/2017   SpO2 99%   BMI 27.64 kg/m   Physical Exam: Gen: NAD Abdomen:  Dressing intact.  Soft, non-tender Fundus Fundal Tone: Firm  Lochia Amount: Small     Data Review Labs: CBC Latest Ref Rng & Units 08/11/2018 06/13/2018 05/26/2018  WBC 3.6 - 11.0 K/uL 9.4 9.6 10.3  Hemoglobin 12.0 - 16.0 g/dL 11.6(L) 10.9(L) 11.0(L)  Hematocrit 35.0 - 47.0 % 33.9(L) 33.5(L) 32.4(L)  Platelets 150 - 440 K/uL 237 279 260   A POS  Assessment:  Active Problems:   Delivery by cesarean section using transverse incision of lower segment of uterus   Doing well.  Normal progress as expected.     Plan:  Discharge to home  Modified rest as directed - may slowly resume normal activities with restrictions  as discussed.  Medications as written.  See below for additional.       Discharge Instructions: Per After Visit Summary. Activity: Advance as tolerated. Pelvic rest for 6 weeks.  Also refer to After Visit Summary.  Wound care discussed. Diet: Regular Medications: Allergies as of 08/12/2018     No Known Allergies     Medication List    TAKE these medications   prenatal multivitamin Tabs tablet Take 1 tablet by mouth daily at 12 noon.      Outpatient follow up:  Postpartum contraception: Will discuss at first post-partum visit.  Discharged Condition: good  Discharged to: home  Newborn Data: Disposition:home with mother  Apgars: APGAR (1 MIN): 9   APGAR (5 MINS): 10      Elonda Huskyavid J. Dawud Mays, M.D. 08/12/2018 10:35 AM

## 2018-08-12 NOTE — Clinical Social Work Maternal (Signed)
  CLINICAL SOCIAL WORK MATERNAL/CHILD NOTE  Patient Details  Name: Teresa Bond MRN: [id number] Date of Birth: [birth year]  Date:  08/12/2018  Clinical Social Worker Initiating Note:  Santiago Bumpers, MSW, [place] Date/Time: Initiated:  08/12/18/1354     Child's Name:  Maryjane Hurter   Biological Parents:  Mother, Father   Need for Interpreter:  None   Reason for Referral:  Current Substance Use/Substance Use During Pregnancy    Address:  [address] [place] [address]    Phone number:  [phone] (home)     Additional phone number: N/A Household Members/Support Persons (HM/SP):       HM/SP Name Relationship DOB or Age  HM/SP -1  FOB      HM/SP -2        HM/SP -3        HM/SP -4        HM/SP -5        HM/SP -6        HM/SP -7        HM/SP -8          Natural Supports (not living in the home):  Immediate Family, Extended Family, Medical laboratory scientific officer, Artist Supports: None   Employment: Full-time   Type of Work:     Education:  Programmer, systems   Homebound arranged:    Museum/gallery curator Resources:  Multimedia programmer   Other Resources:     Cultural/Religious Considerations Which May Impact Care:   None noted  Strengths:  Ability to meet basic needs , Compliance with medical plan , Home prepared for child , Understanding of illness, Pediatrician chosen   Psychotropic Medications:         Pediatrician:    [employer]  Pediatrician List:   [place] Other([facility])  [facility]      Pediatrician Fax Number:    Risk Factors/Current Problems:  None   Cognitive State:  Alert , Goal Oriented , Insightful , Linear Thinking    Mood/Affect:  Relaxed , Calm    CSW Assessment: The CSW met with the MOB at bedside to discuss her history of positive UDS for cannabis in 01/2018. The patient gave verbal permission to discuss in front of her significant other. The  patient shared that this is her 3rd child, and his 2nd, and she has not used cannabis since 01/2018. The CSW explained the mandated reporting policies related to substance use and how cord blood analysis figures into the policy. The CSW explained that if the cord blood analysis is positive, CPS would have to be informed; however, if the analysis is negative, no report would be made. The patient and the FOB verbalized understanding.  The patient will discharge today with her infant. The CSW will continue to monitor for cord blood results.  CSW Plan/Description:  CSW Will Continue to Monitor Umbilical Cord Tissue Drug Screen Results and Make Report if Frutoso Schatz, LCSW 08/12/2018, 1:59 PM

## 2018-08-13 LAB — HERPES SIMPLEX VIRUS CULTURE

## 2018-08-14 LAB — SURGICAL PATHOLOGY

## 2018-08-14 NOTE — Anesthesia Preprocedure Evaluation (Addendum)
Anesthesia Evaluation  Patient identified by MRN, date of birth, ID band Patient awake    Reviewed: Allergy & Precautions, H&P , NPO status , Patient's Chart, lab work & pertinent test results  Airway Mallampati: II  TM Distance: >3 FB Neck ROM: full    Dental  (+) Teeth Intact   Pulmonary neg pulmonary ROS, Current Smoker,    breath sounds clear to auscultation       Cardiovascular negative cardio ROS   Rhythm:regular Rate:Normal     Neuro/Psych negative neurological ROS  negative psych ROS   GI/Hepatic negative GI ROS, Neg liver ROS, GERD  ,  Endo/Other  negative endocrine ROS  Renal/GU negative Renal ROS  negative genitourinary   Musculoskeletal   Abdominal   Peds  Hematology negative hematology ROS (+) anemia ,   Anesthesia Other Findings Past Medical History: 08/10/2018: Anemia     Comment:  borderline during pregnancy. 08/10/2018: GERD (gastroesophageal reflux disease)     Comment:  terrible during this pregnancy. does not take anything               for it. 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 No date: History of kidney stones  Past Surgical History: No date: CESAREAN SECTION 08/11/2018: CESAREAN SECTION WITH BILATERAL TUBAL LIGATION; N/A     Comment:  Procedure: CESAREAN SECTION WITH BILATERAL TUBAL               LIGATION;  Surgeon: Linzie CollinEvans, David James, MD;  Location:               [facility] ORS;  Service: Obstetrics;  Laterality: N/A;  BMI    Body Mass Index:  27.64 kg/m      Reproductive/Obstetrics negative OB ROS                             Anesthesia Physical Anesthesia Plan  ASA: II  Anesthesia Plan: General   Post-op Pain Management:    Induction:   PONV Risk Score and Plan:   Airway Management Planned:   Additional Equipment:   Intra-op Plan:   Post-operative Plan:   Informed Consent: I have reviewed the patients History and Physical, chart, labs and  discussed the procedure including the risks, benefits and alternatives for the proposed anesthesia with the patient or authorized representative who has indicated his/her understanding and acceptance.   Dental Advisory Given  Plan Discussed with: Anesthesiologist, CRNA and Surgeon  Anesthesia Plan Comments:         Anesthesia Quick Evaluation

## 2018-08-17 ENCOUNTER — Ambulatory Visit (INDEPENDENT_AMBULATORY_CARE_PROVIDER_SITE_OTHER): Payer: 59 | Admitting: Obstetrics and Gynecology

## 2018-08-17 ENCOUNTER — Encounter: Payer: Self-pay | Admitting: Obstetrics and Gynecology

## 2018-08-17 VITALS — BP 116/79 | HR 74 | Ht 64.0 in | Wt 149.0 lb

## 2018-08-17 DIAGNOSIS — B009 Herpesviral infection, unspecified: Secondary | ICD-10-CM

## 2018-08-17 DIAGNOSIS — Z9889 Other specified postprocedural states: Secondary | ICD-10-CM

## 2018-08-17 NOTE — Progress Notes (Signed)
HPI:      [name] is a 32 y.o. [phone] who LMP was No LMP recorded.  Subjective:   She presents today for postop check after cesarean delivery.  Patient reports that she is doing well.  She is breast-feeding full-time without issue.  She reports her incision is healing well.  She would like to take the Steri-Strips off. She reports that her vulvar lesion resolved soon after we did the cultures and she has had no problems from it since then.  She reports no previous history of any lesions like that.    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 She does not have any pertinent problems on file. She  has a past surgical history that includes Cesarean section and Cesarean section with bilateral tubal ligation (N/A, 08/11/2018). Her family history includes COPD in her mother; Cancer in her maternal grandmother and paternal grandmother. She  reports that she has been smoking cigarettes. She has a 7.50 pack-year smoking history. She has never used smokeless tobacco. She reports that she has current or past drug history. Drug: Marijuana. She reports that she does not drink alcohol. She has a current medication list which includes the following prescription(s): prenatal multivitamin. She has No Known Allergies.       Review of Systems:  Review of Systems  Constitutional: Denied constitutional symptoms, night sweats, recent illness, fatigue, fever, insomnia and weight loss.  Eyes: Denied eye symptoms, eye pain, photophobia, vision change and visual disturbance.  Ears/Nose/Throat/Neck: Denied ear, nose, throat or neck symptoms, hearing loss, nasal discharge, sinus congestion and sore throat.  Cardiovascular: Denied cardiovascular symptoms, arrhythmia, chest  pain/pressure, edema, exercise intolerance, orthopnea and palpitations.  Respiratory: Denied pulmonary symptoms, asthma, pleuritic pain, productive sputum, cough, dyspnea and wheezing.  Gastrointestinal: Denied, gastro-esophageal reflux, melena, nausea and vomiting.  Genitourinary: Denied genitourinary symptoms including symptomatic vaginal discharge, pelvic relaxation issues, and urinary complaints.  Musculoskeletal: Denied musculoskeletal symptoms, stiffness, swelling, muscle weakness and myalgia.  Dermatologic: Denied dermatology symptoms, rash and scar.  Neurologic: Denied neurology symptoms, dizziness, headache, neck pain and syncope.  Psychiatric: Denied psychiatric symptoms, anxiety and depression.  Endocrine: Denied endocrine symptoms including hot flashes and night sweats.   Meds:   Current Outpatient Medications on File Prior to Visit  Medication Sig Dispense Refill  . Prenatal Vit-Fe Fumarate-FA (PRENATAL MULTIVITAMIN) TABS tablet Take 1 tablet by mouth daily at 12 noon.     No current facility-administered medications on file prior to visit.     Objective:     Vitals:   08/17/18 1428  BP: 116/79  Pulse: 74               Abdomen: Soft.  Non-tender.  No masses.  No HSM.  Incision/s: Intact.  Healing well.  No erythema.  No drainage.      Assessment:    [id number] Patient Active Problem List   Diagnosis Date Noted  . Delivery by cesarean section using transverse incision of lower segment of uterus 08/11/2018  . Preterm uterine contractions in third trimester, antepartum 07/09/2018  . Pregnancy 07/08/2018  . Anemia of pregnancy in third trimester 06/27/2018  . Left ovarian cyst 04/11/2018  . 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 Previous cesarean section 02/06/2018  .  Smoker 02/06/2018  . Rubella non-immune status, antepartum 02/06/2018     1. Post-operative state   2. HSV (herpes simplex virus) infection     Discussed HSV in detail with the patient.  Episodic versus  suppressive therapy discussed in detail.  Natural course and history of herpes discussed in detail.  Patient was unaware of any previous outbreaks.  Somewhat tearful at the news of HSV.   Plan:            1.  Discussed wound care in detail.  2.  HSV discussed-patient will contact us if she has another outbreak and will consider episodic versus suppressive therapy.  Baby, sexual partners, and transmission discussed in detail.  All questions answered. Orders No orders of the defined types were placed in this encounter.   No orders of the defined types were placed in this encounter.     F/U  Return in about 5 weeks (around 09/21/2018).  Elonda Huskyavid J. Evans, M.D. 08/17/2018 2:43 PM

## 2018-08-17 NOTE — Progress Notes (Signed)
Pt presents today for 1 week incision check after c-section.

## 2018-09-21 ENCOUNTER — Encounter: Payer: 59 | Admitting: Obstetrics and Gynecology

## 2018-09-21 ENCOUNTER — Ambulatory Visit (INDEPENDENT_AMBULATORY_CARE_PROVIDER_SITE_OTHER): Payer: 59 | Admitting: Obstetrics and Gynecology

## 2018-09-21 ENCOUNTER — Encounter: Payer: Self-pay | Admitting: Obstetrics and Gynecology

## 2018-09-21 VITALS — BP 111/74 | HR 80 | Ht 64.0 in | Wt 142.0 lb

## 2018-09-21 DIAGNOSIS — Z9889 Other specified postprocedural states: Secondary | ICD-10-CM

## 2018-09-21 NOTE — Progress Notes (Signed)
Pt presents today for post op follow up. Pt states she is feeling well, though still abdominal soreness

## 2018-09-21 NOTE — Progress Notes (Signed)
HPI:      [name] is a 32 y.o. [phone] who LMP was No LMP recorded.  Subjective:   She presents today approximately 5 weeks postpartum from cesarean delivery.  She continues to breast-feed full-time.  She has no complaints.  She is eating voiding and having bowel movements without issue.  She has not stopped bleeding yet since her delivery. She has a tubal ligation for birth control.    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 She does not have any pertinent problems on file. She  has a past surgical history that includes Cesarean section and Cesarean section with bilateral tubal ligation (N/A, 08/11/2018). Her family history includes COPD in her mother; Cancer in her maternal grandmother and paternal grandmother. She  reports that she has been smoking cigarettes. She has a 7.50 pack-year smoking history. She has never used smokeless tobacco. She reports that she has current or past drug history. Drug: Marijuana. She reports that she does not drink alcohol. She has a current medication list which includes the following prescription(s): prenatal multivitamin. She has No Known Allergies.       Review of Systems:  Review of Systems  Constitutional: Denied constitutional symptoms, night sweats, recent illness, fatigue, fever, insomnia and weight loss.  Eyes: Denied eye symptoms, eye pain, photophobia, vision change and visual disturbance.  Ears/Nose/Throat/Neck: Denied ear, nose, throat or neck symptoms, hearing loss, nasal discharge, sinus congestion and sore throat.  Cardiovascular: Denied cardiovascular symptoms, arrhythmia, chest pain/pressure, edema, exercise intolerance, orthopnea and palpitations.  Respiratory: Denied pulmonary symptoms, asthma,  pleuritic pain, productive sputum, cough, dyspnea and wheezing.  Gastrointestinal: Denied, gastro-esophageal reflux, melena, nausea and vomiting.  Genitourinary: Denied genitourinary symptoms including symptomatic vaginal discharge, pelvic relaxation issues, and urinary complaints.  Musculoskeletal: Denied musculoskeletal symptoms, stiffness, swelling, muscle weakness and myalgia.  Dermatologic: Denied dermatology symptoms, rash and scar.  Neurologic: Denied neurology symptoms, dizziness, headache, neck pain and syncope.  Psychiatric: Denied psychiatric symptoms, anxiety and depression.  Endocrine: Denied endocrine symptoms including hot flashes and night sweats.   Meds:   Current Outpatient Medications on File Prior to Visit  Medication Sig Dispense Refill  . Prenatal Vit-Fe Fumarate-FA (PRENATAL MULTIVITAMIN) TABS tablet Take 1 tablet by mouth daily at 12 noon.     No current facility-administered medications on file prior to visit.     Objective:     Vitals:   09/21/18 1122  BP: 111/74  Pulse: 80               Abdomen: Soft.  Non-tender.  No masses.  No HSM.  Incision/s: Intact.  Healing well.  No erythema.  No drainage.     Assessment:    [id number] Patient Active Problem List   Diagnosis Date Noted  . Delivery by cesarean section using transverse incision of lower segment of uterus 08/11/2018  . Preterm uterine contractions in third trimester, antepartum 07/09/2018  . Pregnancy 07/08/2018  . Anemia of pregnancy in third trimester 06/27/2018  . Left ovarian cyst 04/11/2018  . 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 Previous cesarean section 02/06/2018  . Smoker 02/06/2018  . Rubella non-immune status, antepartum 02/06/2018     1. Post-operative state     Patient doing well wound healing  without issue breast-feeding full-time.   Plan:            1.  Patient may resume normal activities in the next 2 weeks.  Return to work.  Postpartum depression discussed but patient  seems to be doing well.  If this changes she is to contact us at once. Orders No orders of the defined types were placed in this encounter.   No orders of the defined types were placed in this encounter.     F/U  Return in about 4 months (around 01/22/2019).  Elonda Husky, M.D. 09/21/2018 11:57 AM

## 2018-12-17 IMAGING — US US OB TRANSVAGINAL
1 series · 13 of 28 positions shown · non-contrast
Comparison: None.

ADDENDUM:
Typographical error within the impression. The 1st sentence of the
2nd impression should read

Complex exophytic lesion off the left ovary with cystic and solid
components.
CLINICAL DATA: Abdominal pain
EXAM:
OBSTETRIC <14 WK US AND TRANSVAGINAL OB US
TECHNIQUE: Both transabdominal and transvaginal ultrasound examinations were
performed for complete evaluation of the gestation as well as the
maternal uterus, adnexal regions, and pelvic cul-de-sac.
Transvaginal technique was performed to assess early pregnancy.

[Series 1: us ob transvaginal · 0.22mm/px · 13 of 100 slices shown]
[im 4/100]
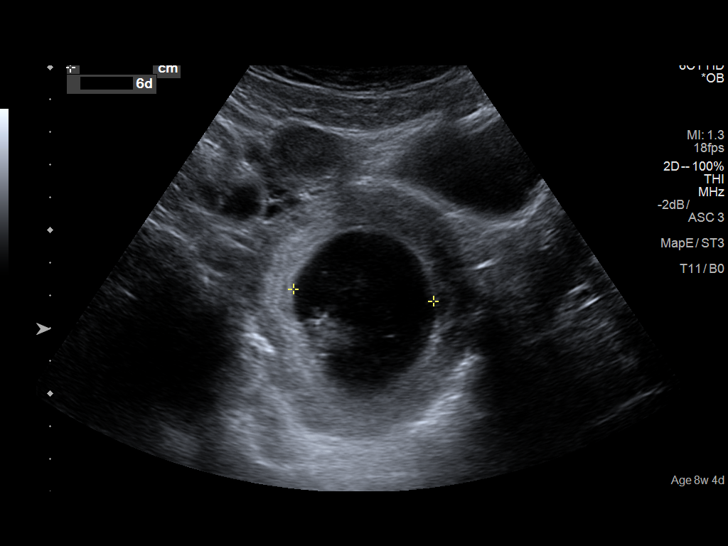
[im 12/100]
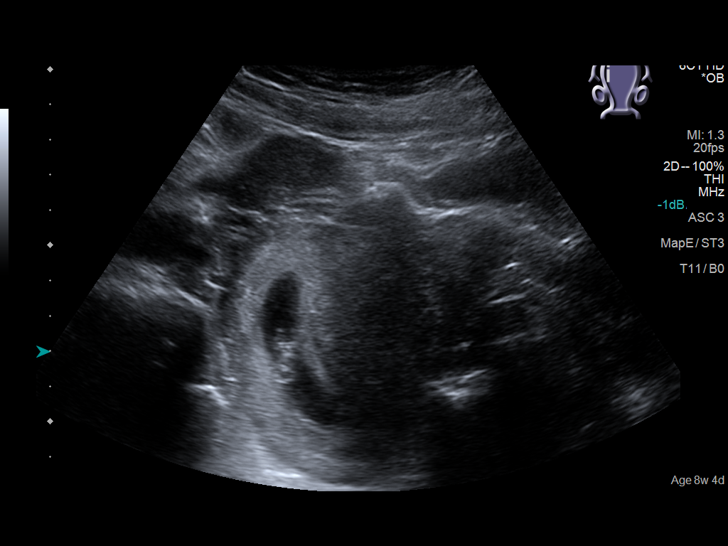
[im 19/100]
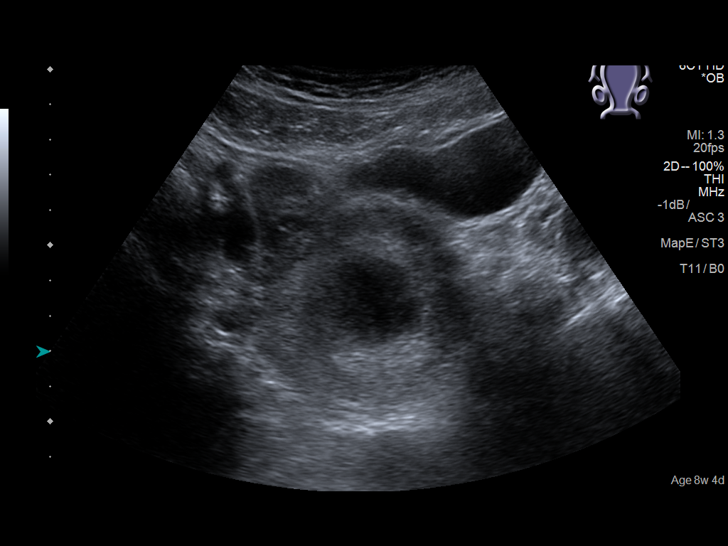
[im 26/100]
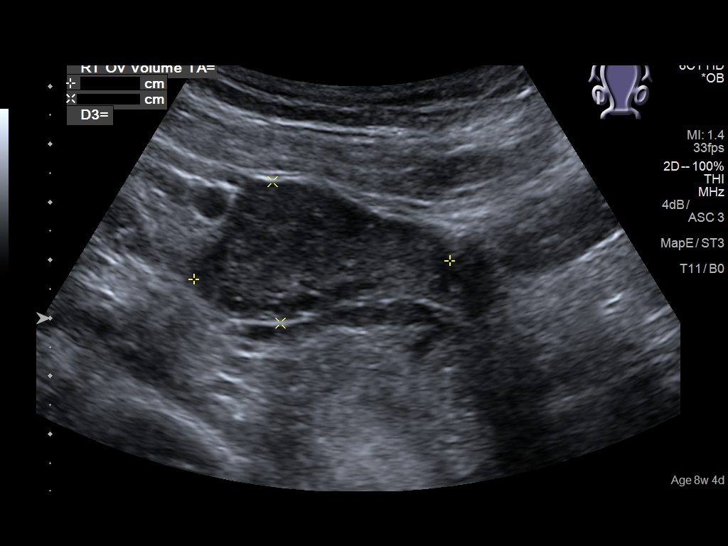
[im 34/100]
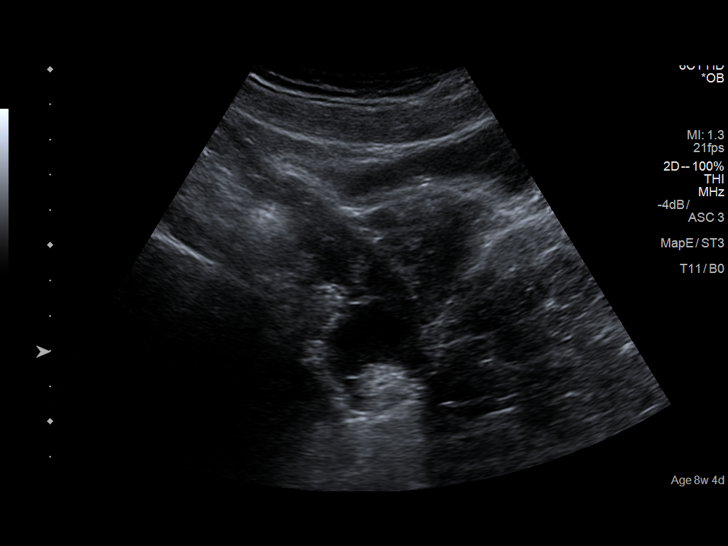
[im 41/100]
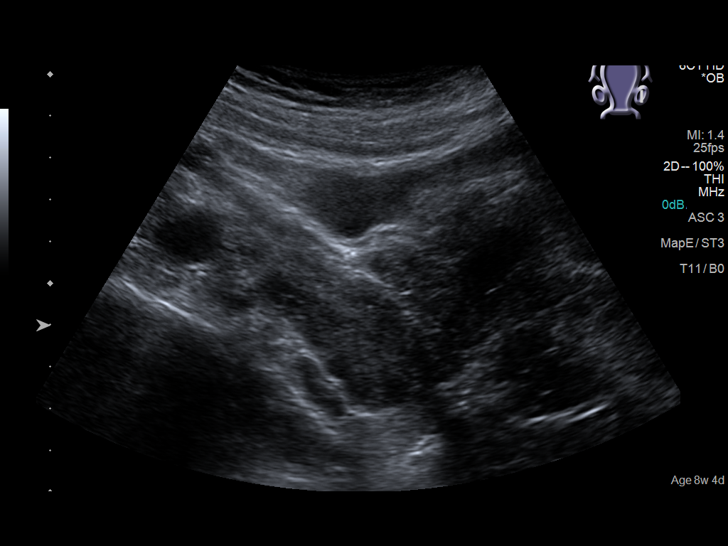
[im 52/100]
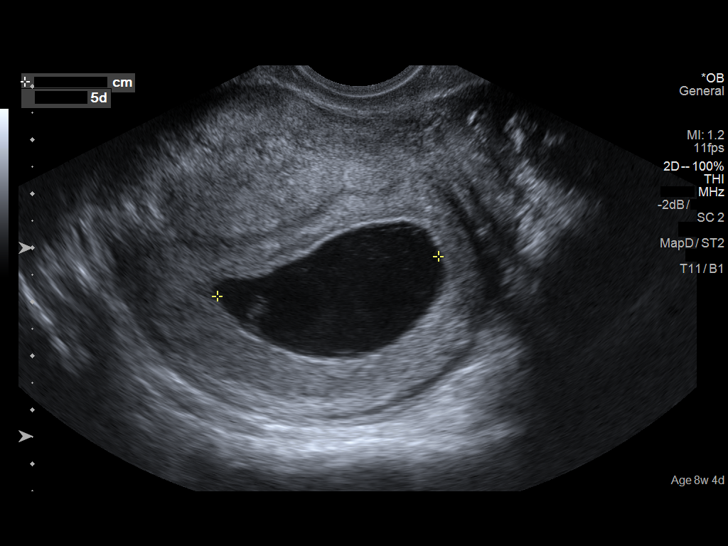
[im 59/100]
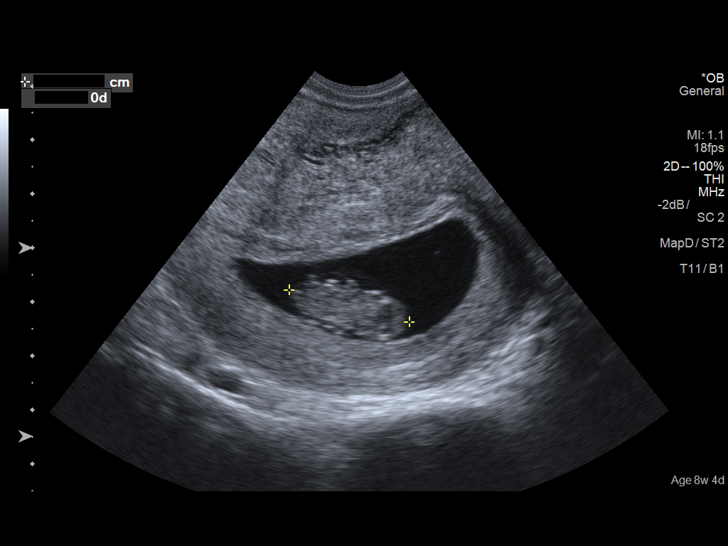
[im 67/100]
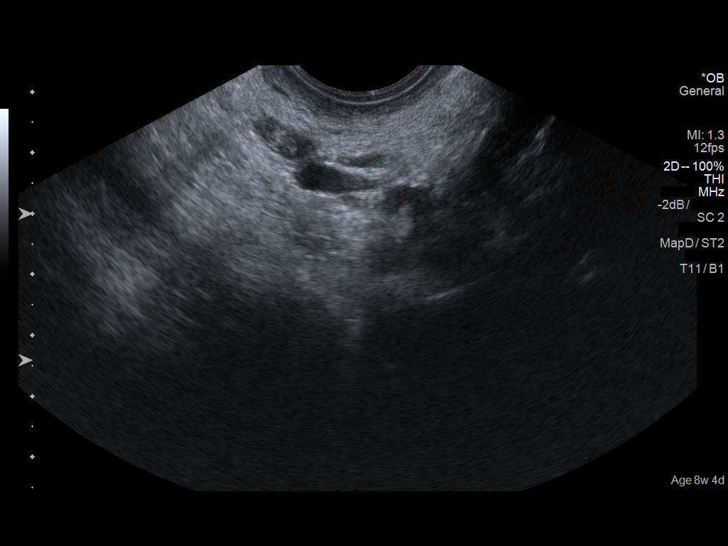
[im 74/100]
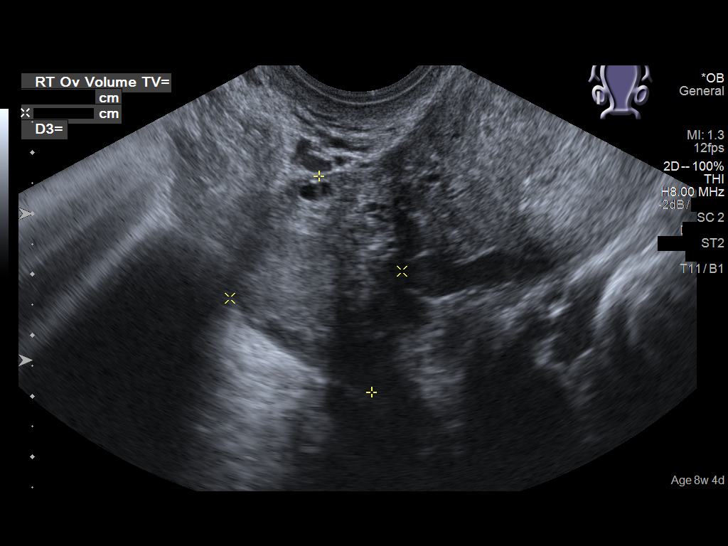
[im 81/100]
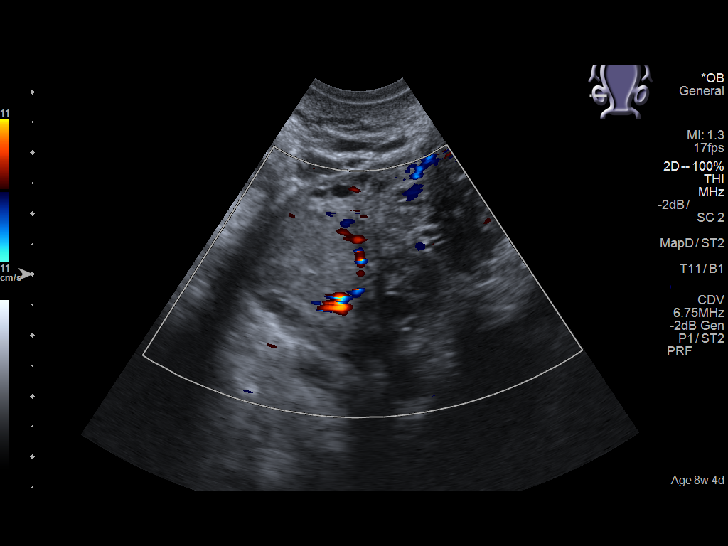
[im 89/100]
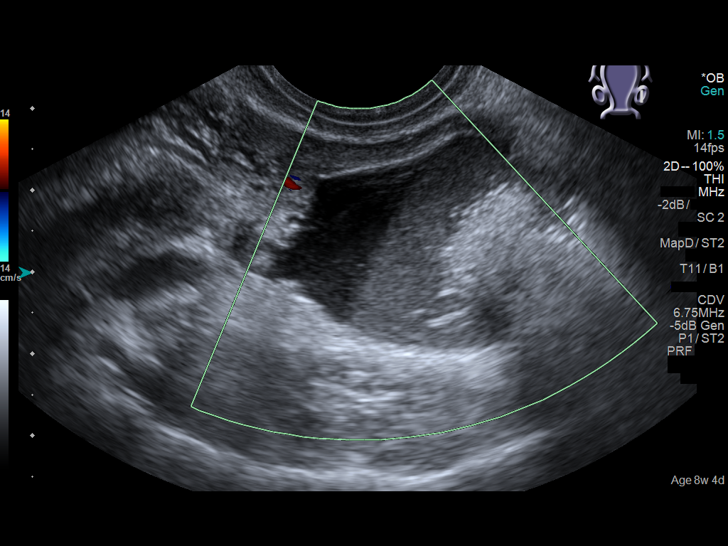
[im 96/100]
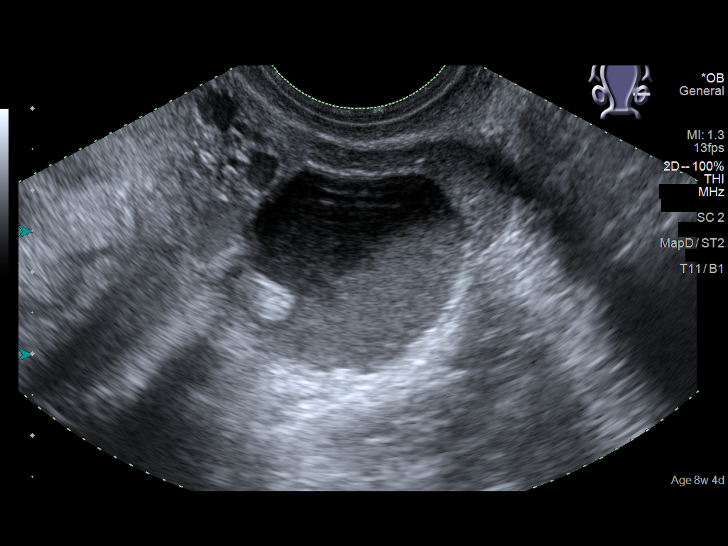

[13 of 28 positions shown; findings below may reference images not displayed]

FINDINGS: Intrauterine gestational sac: Single

Yolk sac:  Visualized

Embryo:  Visualized

Cardiac Activity: Visualized

Heart Rate: 185 bpm

MSD:   mm    w     d

CRL:  23.5 mm   9 w   0 d                  US EDC: 08/17/2018

Subchorionic hemorrhage:  None visualized.

Maternal uterus/adnexae: Complex exophytic mass off the left ovary
measures 3.6 x 2.4 x 2.8 cm. This contains cystic components and
solid components, some which are echogenic. This could represent
hemorrhagic cyst or dermoid.
IMPRESSION: Nine week intrauterine pregnancy. Fetal heart rate 185 beats per
minute.

Complex exophytic lesion off the left kidney with cystic and solid
components. This could represent a hemorrhagic cyst or dermoid cyst.
This could be followed with repeat ultrasound after pregnancy.

## 2019-01-03 ENCOUNTER — Encounter: Payer: 59 | Admitting: Obstetrics and Gynecology

## 2021-03-12 ENCOUNTER — Other Ambulatory Visit: Payer: Self-pay

## 2021-03-12 ENCOUNTER — Other Ambulatory Visit: Payer: Self-pay | Admitting: Pulmonary Disease

## 2021-03-12 ENCOUNTER — Ambulatory Visit (INDEPENDENT_AMBULATORY_CARE_PROVIDER_SITE_OTHER): Payer: Managed Care, Other (non HMO) | Admitting: Pulmonary Disease

## 2021-03-12 ENCOUNTER — Ambulatory Visit (INDEPENDENT_AMBULATORY_CARE_PROVIDER_SITE_OTHER): Payer: Managed Care, Other (non HMO)

## 2021-03-12 ENCOUNTER — Encounter: Payer: Self-pay | Admitting: Pulmonary Disease

## 2021-03-12 VITALS — BP 120/74 | HR 79 | Temp 98.2°F | Ht 64.0 in | Wt 163.4 lb

## 2021-03-12 DIAGNOSIS — R059 Cough, unspecified: Secondary | ICD-10-CM

## 2021-03-12 DIAGNOSIS — R051 Acute cough: Secondary | ICD-10-CM | POA: Diagnosis not present

## 2021-03-12 MED ORDER — PREDNISONE 10 MG PO TABS: ORAL_TABLET | ORAL | 0 refills | Status: AC

## 2021-03-12 MED ORDER — BENZONATATE 100 MG PO CAPS: 200.0000 mg | ORAL_CAPSULE | Freq: Four times a day (QID) | ORAL | 1 refills | Status: AC | PRN

## 2021-03-12 MED ORDER — PREDNISONE 10 MG (21) PO TBPK: ORAL_TABLET | Freq: Every day | ORAL | 0 refills | Status: AC

## 2021-03-12 NOTE — Addendum Note (Signed)
Addended by: Maanasa Aderhold R on: 03/12/2021 10:10 AM   Modules accepted: Orders  

## 2021-03-12 NOTE — Patient Instructions (Addendum)
Prednisone 10 mg tabs Take 4 tabs  daily with food x 4 days, then 3 tabs daily x 4 days, then 2 tabs daily x 4 days, then 1 tab daily x4 days then stop. #40  Take Zyrtec 10 mg daily. Prescription for Tessalon Perles 200 mg thrice daily as needed for cough. Okay to take Delsym 5 mL thrice daily for 1 week   You have to quit smoking! Call us back to report in 1 week

## 2021-03-12 NOTE — Progress Notes (Signed)
Subjective:    Patient ID: Teresa Bond, female    DOB: Nov 16, 1986, 35 y.o.   MRN: [id number]  HPI  35 year old smoker presents for evaluation of acute cough that started about a week ago. She reports Covid infection in January 2022 which manifested as fever and headache.  No other family members were sick.  She works at [employer] in [employer] and lives with her fianc and 2 kids ages 2 and 7.  She had sudden onset cough about 8 days ago which lasted throughout the day and keeps her awake at night.  She has not had a good night sleep.  Cough is nonproductive, worse with talking, laughing and while sleeping.  She has tried OTC Delsym, Halls and Alka-Seltzer without any relief.  Her chest is hurting.  She denies postnasal drip, nasal congestion or itchy eyes.  She denies seasonal allergies.  She used to live in [place] and is moved back to [place] 6 years ago  She smokes 6 to 10 cigarettes/day and drinks alcohol every other day  Her mother has COPD from smoking   Past Medical History:  Diagnosis Date  . Anemia 08/10/2018   borderline during pregnancy.  Marland Kitchen GERD (gastroesophageal reflux disease) 08/10/2018   terrible during this pregnancy. does not take anything for it.  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 History of kidney stones     Past Surgical History:  Procedure Laterality Date  . CESAREAN SECTION    . CESAREAN SECTION WITH BILATERAL TUBAL LIGATION N/A 08/11/2018   Procedure: CESAREAN SECTION WITH BILATERAL TUBAL LIGATION;  Surgeon: Linzie Collin, MD;  Location: [facility] OR[facility];  Service: Obstetrics;  Laterality: N/A;    No Known Allergies  Social History   Socioeconomic History  . Marital status: Single    Spouse name: Not on file  . Number of children: Not on file  . Years of education: Not on file  . Highest education level: Not on file  Occupational History  . Occupation: pt billing  Tobacco Use  . Smoking status: Current Every Day Smoker     Packs/day: 0.50    Years: 15.00    Pack years: 7.50    Types: Cigarettes  . Smokeless tobacco: Never Used  . Tobacco comment: 6-10 cigarettes smoked daily. 03/12/21 [facility]   Vaping Use  . Vaping Use: Never used  Substance and Sexual Activity  . Alcohol use: No  . Drug use: Yes    Types: Marijuana    Comment: stopped once she found out she was pregnant  . Sexual activity: Yes    Birth control/protection: Surgical    Comment: tubal  Other Topics Concern  . Not on file  Social History Narrative  . Not on file   Social Determinants of Health   Financial Resource Strain: Not on file  Food Insecurity: Not on file  Transportation Needs: Not on file  Physical Activity: Not on file  Stress: Not on file  Social Connections: Not on file  Intimate Partner Violence: Not on file    Family History  Problem Relation Age of Onset  . Cancer Maternal Grandmother        ovarian  . Cancer Paternal Grandmother        breast  . COPD Mother      Review of Systems Constitutional: negative for anorexia, fevers and sweats  Eyes: negative for irritation, redness and visual disturbance  Ears, nose, mouth, throat, and face: negative for earaches,  epistaxis, nasal congestion and sore throat   Cardiovascular: negative for lower extremity edema, orthopnea, palpitations and syncope  Gastrointestinal: negative for abdominal pain, constipation, diarrhea, melena, nausea and vomiting  Genitourinary:negative for dysuria, frequency and hematuria  Hematologic/lymphatic: negative for bleeding, easy bruising and lymphadenopathy  Musculoskeletal:negative for arthralgias, muscle weakness and stiff joints  Neurological: negative for coordination problems, gait problems, headaches and weakness  Endocrine: negative for diabetic symptoms including polydipsia, polyuria and weight loss     Objective:   Physical Exam  Gen. Pleasant, well-nourished, in no distress, normal affect ENT - no pallor,icterus, no post  nasal drip, large tonsils Neck: No JVD, no thyromegaly, no carotid bruits Lungs: no use of accessory muscles, no dullness to percussion, BL scattered rhonchi  Cardiovascular: Rhythm regular, heart sounds  normal, no murmurs or gallops, no peripheral edema Abdomen: soft and non-tender, no hepatosplenomegaly, BS normal. Musculoskeletal: No deformities, no cyanosis or clubbing Neuro:  alert, non focal       Assessment & Plan:

## 2021-03-12 NOTE — Assessment & Plan Note (Addendum)
Differential here for acute cough includes environmental allergies, viral infection, or other occult etiologies of chronic cough including postnasal drip or reflux although she does not appear to be overly symptomatic for these. Chest x-ray was obtained, independently reviewed, no infiltrates, mild hyperinflation She had Covid infection in January so unlikely that this is reinfection, also she tested negative with the antigen test.  She does not seem to have a viral prodrome and no other family members are sick.  Regardless does not seem to require antibiotics currently. We will treat with a course of steroids for environmental allergies and asked her to take Zyrtec 10 mg daily for 2 to 4 weeks. Tessalon Perles will be provided for symptomatic relief and she can continue to take Delsym OTC.  If cough is persistent then can give her Cheratussin or other codeine containing syrup  Clearly smoking cessation is paramount and this was emphasized

## 2022-06-28 ENCOUNTER — Ambulatory Visit (INDEPENDENT_AMBULATORY_CARE_PROVIDER_SITE_OTHER): Payer: Managed Care, Other (non HMO) | Admitting: Obstetrics

## 2022-06-28 ENCOUNTER — Encounter: Payer: Self-pay | Admitting: Obstetrics

## 2022-06-28 VITALS — BP 128/89 | HR 69 | Ht 64.0 in | Wt 169.4 lb

## 2022-06-28 DIAGNOSIS — Z8742 Personal history of other diseases of the female genital tract: Secondary | ICD-10-CM | POA: Diagnosis not present

## 2022-06-28 DIAGNOSIS — Z01419 Encounter for gynecological examination (general) (routine) without abnormal findings: Secondary | ICD-10-CM | POA: Diagnosis not present

## 2022-06-28 DIAGNOSIS — Z124 Encounter for screening for malignant neoplasm of cervix: Secondary | ICD-10-CM | POA: Diagnosis not present

## 2022-06-28 DIAGNOSIS — Z113 Encounter for screening for infections with a predominantly sexual mode of transmission: Secondary | ICD-10-CM

## 2022-06-28 MED ORDER — TRANEXAMIC ACID 650 MG PO TABS
1300.0000 mg | ORAL_TABLET | Freq: Three times a day (TID) | ORAL | 2 refills | Status: AC
Start: 2022-06-28 — End: ?

## 2022-06-28 MED ORDER — BUPROPION HCL ER (SR) 150 MG PO TB12
150.0000 mg | ORAL_TABLET | Freq: Two times a day (BID) | ORAL | 3 refills | Status: AC
Start: 2022-06-28 — End: ?

## 2022-06-28 NOTE — Progress Notes (Signed)
SUBJECTIVE  HPI  Teresa Bond is a 36 y.o.-year-old female who presents for an annual gynecological exam and Pap smear today. She has a history of HPV. Her previous OB/GYN office closed before she was able to complete adequate follow up. 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 Concerns today includes excessive menstrual bleeding and ovarian pain. She reports that her periods last 2 weeks now. The first week is heavy bleeding. The second week is lighter pinkish bleeding but she states she also has a fishy odor during that week.  This has been going on since she had her BTL. She has not noticed a pattern to her ovarian pain, but she states that it is bilateral and it typically lasts 1-2 days. The pain is so intense that it stops her and causes her to double over. She has a history of ovarian cysts. She would also like to discuss smoking cessation. She has tried quitting on her own with nicotine patches and has not been successful.  Medical/Surgical History Past Medical History:  Diagnosis Date   Anemia 08/10/2018   borderline during pregnancy.   GERD (gastroesophageal reflux disease) 08/10/2018   terrible during this pregnancy. does not take anything for it.   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   History of kidney stones    Past Surgical History:  Procedure Laterality Date   CESAREAN SECTION     CESAREAN SECTION WITH BILATERAL TUBAL LIGATION N/A 08/11/2018   Procedure: CESAREAN SECTION WITH BILATERAL TUBAL LIGATION;  Surgeon: Linzie Collin, MD;  Location: [facility] ORS;  Service: Obstetrics;  Laterality: N/A;    Social History Lives with partner and 2 kids (daughter lives with her over the summer) Work: Chief Operating Officer, works from home Exercise: cleaning the house Substances: smokes 1/2-1 ppd, vapes occasionally, +MJ  Obstetric History OB History     Gravida  5   Para  2   Term  2   Preterm      AB  2   Living  2      SAB  2   IAB      Ectopic       Multiple      Live Births  2            GYN/Menstrual History Patient's last menstrual period was 06/27/2022 (exact date). regular periods every month, heavy, painful Last Pap: unsure Contraception: BTL  Prevention Encouraged regular dental and eye exams Mammogram: at 40 Colonscopy: at 45  Current Medications Outpatient Medications Prior to Visit  Medication Sig   benzonatate (TESSALON) 100 MG capsule Take 2 capsules (200 mg total) by mouth every 6 (six) hours as needed for cough.   predniSONE (STERAPRED UNI-PAK 21 TAB) 10 MG (21) TBPK tablet Take by mouth daily. 10 mg tabs Take 4 tabs  daily with food x 4 days, then 3 tabs daily x 4 days, then 2 tabs daily x 4 days, then 1 tab daily x4 days then stop.   Prenatal Vit-Fe Fumarate-FA (PRENATAL MULTIVITAMIN) TABS tablet Take 1 tablet by mouth daily at 12 noon. (Patient not taking: Reported on 03/12/2021)   No facility-administered medications prior to visit.      Upstream - 06/28/22 1107       Pregnancy Intention Screening   Does the patient want to become pregnant in the next year? No    Does the patient's partner want to become pregnant in the next year? No  Would the patient like to discuss contraceptive options today? No            The pregnancy intention screening data noted above was reviewed. Potential methods of contraception were discussed. The patient elected to proceed with No data recorded.   ROS History obtained from the patient General ROS: positive for  - night sweats negative for - chills, fatigue, or fever Psychological ROS: negative for - anxiety or depression Ophthalmic ROS: negative for - blurry vision or decreased vision ENT ROS: negative for - headaches or sore throat Hematological and Lymphatic ROS: positive for - bruising negative for - bleeding problems or swollen lymph nodes Endocrine ROS: positive for - polydipsia/polyuria negative for - breast changes or palpitations Breast ROS:  negative for breast lumps Respiratory ROS: no cough, shortness of breath, or wheezing Cardiovascular ROS: no chest pain or dyspnea on exertion Gastrointestinal ROS: no abdominal pain, change in bowel habits, or black or bloody stools Genito-Urinary ROS: no dysuria, trouble voiding, or hematuria Musculoskeletal ROS: negative Dermatological ROS: negative     09/21/2018   11:24 AM 05/11/2018    4:53 PM  Depression screen PHQ 2/9  Decreased Interest 0 2  Down, Depressed, Hopeless 1 2  PHQ - 2 Score 1 4  Altered sleeping 1 1  Tired, decreased energy 1 1  Change in appetite 1 1  Feeling bad or failure about yourself  0 0  Trouble concentrating 3 1  Moving slowly or fidgety/restless 0 0  Suicidal thoughts 0 0  PHQ-9 Score 7 8  Difficult doing work/chores Not difficult at all      OBJECTIVE  Last Weight  Most recent update: 06/28/2022 11:05 AM    Weight  76.8 kg (169 lb 6.4 oz)             Body mass index is 29.08 kg/m.    BP 128/89   Pulse 69   Ht 5\' 4"  (1.626 m)   Wt 169 lb 6.4 oz (76.8 kg)   LMP 06/27/2022 (Exact Date)   Breastfeeding No   BMI 29.08 kg/m  General appearance: alert, cooperative, and appears stated age Head: Normocephalic, without obvious abnormality, atraumatic Eyes: negative findings: lids and lashes normal and conjunctivae and sclerae normal Neck: no adenopathy, supple, symmetrical, trachea midline, and thyroid not enlarged, symmetric, no tenderness/mass/nodules Lungs: clear to auscultation bilaterally Breasts: normal appearance, no masses or tenderness, No nipple retraction or dimpling, No axillary or supraclavicular adenopathy, Normal to palpation without dominant masses Heart: regular rate and rhythm, S1, S2 normal, no murmur, click, rub or gallop Abdomen: soft, non-tender; bowel sounds normal; no masses,  no organomegaly Pelvic: cervix normal in appearance, external genitalia normal, no cervical motion tenderness, rectovaginal septum normal,  vagina normal without discharge, and Pap collected Extremities: extremities normal, atraumatic, no cyanosis or edema Pulses: 2+ and symmetric Skin: Skin color, texture, turgor normal. No rashes or lesions Lymph nodes: Cervical, supraclavicular, and axillary nodes normal.  ASSESSMENT  1) Annual exam 2) Unsure of last Pap 3) Abnormal uterine bleeding 4) Ovarian pain 5) Desires assistance with smoking cessation  PLAN 1) Physical exam as noted. STI testing done. Labs: TSH, A1C, CBC, CMP, lipid profile. Discussed healthy lifestyle changes.  2) Pap collected. F/u based on results. 3) Discussed options including IUD or other progestin-only contraceptives, TXA. Lavenia would like to schedule an IUD placement. She would to try TXA until then. Rx sent. Instructions on proper use given. Swab collected for vaginal odor. 4) Pelvic 08/28/2022 ordered 5)  Rx for Wellbutrin given with instructions on when/how to take for smoking cessation.  Follow up PRN once all labs and imaging are completed. RTC for IUD placement and in one year for annual visit.  Guadlupe Spanish, CNM

## 2022-06-29 ENCOUNTER — Telehealth: Payer: Self-pay | Admitting: Obstetrics

## 2022-06-29 NOTE — Telephone Encounter (Signed)
Left message- [place] scheduled and sent mychart message with info

## 2022-06-30 ENCOUNTER — Other Ambulatory Visit: Payer: Self-pay | Admitting: Obstetrics

## 2022-06-30 ENCOUNTER — Encounter: Payer: Self-pay | Admitting: Obstetrics

## 2022-06-30 LAB — NUSWAB VAGINITIS PLUS (VG+)
Candida albicans, NAA: NEGATIVE
Candida glabrata, NAA: NEGATIVE
Chlamydia trachomatis, NAA: NEGATIVE
Megasphaera 1: HIGH Score — AB
Neisseria gonorrhoeae, NAA: NEGATIVE
Trich vag by NAA: NEGATIVE

## 2022-06-30 MED ORDER — METRONIDAZOLE 500 MG PO TABS: 500.0000 mg | ORAL_TABLET | Freq: Two times a day (BID) | ORAL | 0 refills | Status: AC

## 2022-07-01 ENCOUNTER — Other Ambulatory Visit: Payer: Managed Care, Other (non HMO)

## 2022-07-02 ENCOUNTER — Ambulatory Visit: Payer: Managed Care, Other (non HMO)

## 2022-07-02 LAB — HIV ANTIBODY (ROUTINE TESTING W REFLEX): HIV Screen 4th Generation wRfx: NONREACTIVE

## 2022-07-02 LAB — CBC WITH DIFFERENTIAL/PLATELET
Basophils Absolute: 0.1 10*3/uL (ref 0.0–0.2)
Basos: 1 %
EOS (ABSOLUTE): 0.2 10*3/uL (ref 0.0–0.4)
Eos: 3 %
Hematocrit: 43.7 % (ref 34.0–46.6)
Hemoglobin: 14.6 g/dL (ref 11.1–15.9)
Immature Grans (Abs): 0 10*3/uL (ref 0.0–0.1)
Immature Granulocytes: 0 %
Lymphocytes Absolute: 2.2 10*3/uL (ref 0.7–3.1)
Lymphs: 40 %
MCH: 29.4 pg (ref 26.6–33.0)
MCHC: 33.4 g/dL (ref 31.5–35.7)
MCV: 88 fL (ref 79–97)
Monocytes Absolute: 0.6 10*3/uL (ref 0.1–0.9)
Monocytes: 11 %
Neutrophils Absolute: 2.5 10*3/uL (ref 1.4–7.0)
Neutrophils: 45 %
Platelets: 288 10*3/uL (ref 150–450)
RBC: 4.96 x10E6/uL (ref 3.77–5.28)
RDW: 13.1 % (ref 11.7–15.4)
WBC: 5.5 10*3/uL (ref 3.4–10.8)

## 2022-07-02 LAB — LIPID PANEL
Chol/HDL Ratio: 3.3 ratio (ref 0.0–4.4)
Cholesterol, Total: 232 mg/dL — ABNORMAL HIGH (ref 100–199)
HDL: 71 mg/dL (ref >39)
LDL Chol Calc (NIH): 132 mg/dL — ABNORMAL HIGH (ref 0–99)
Triglycerides: 167 mg/dL — ABNORMAL HIGH (ref 0–149)
VLDL Cholesterol Cal: 29 mg/dL (ref 5–40)

## 2022-07-02 LAB — TSH: TSH: 1.16 u[IU]/mL (ref 0.450–4.500)

## 2022-07-02 LAB — HEPATITIS C ANTIBODY: Hep C Virus Ab: NONREACTIVE

## 2022-07-02 LAB — RPR: RPR Ser Ql: NONREACTIVE

## 2022-07-02 LAB — HEMOGLOBIN A1C
Est. average glucose Bld gHb Est-mCnc: 108 mg/dL
Hgb A1c MFr Bld: 5.4 % (ref 4.8–5.6)

## 2022-07-02 LAB — HEPATITIS B SURFACE ANTIBODY,QUALITATIVE: Hep B Surface Ab, Qual: NONREACTIVE

## 2022-07-02 LAB — HEPATITIS B SURFACE ANTIGEN: Hepatitis B Surface Ag: NEGATIVE

## 2022-07-05 ENCOUNTER — Other Ambulatory Visit (HOSPITAL_COMMUNITY)
Admission: RE | Admit: 2022-07-05 | Discharge: 2022-07-05 | Disposition: A | Discharge status: Home / Self Care | Payer: Managed Care, Other (non HMO) | Source: Ambulatory Visit | Attending: Obstetrics | Admitting: Obstetrics

## 2022-07-05 ENCOUNTER — Ambulatory Visit (INDEPENDENT_AMBULATORY_CARE_PROVIDER_SITE_OTHER): Payer: Managed Care, Other (non HMO) | Admitting: Obstetrics

## 2022-07-05 VITALS — Ht 64.0 in | Wt 169.0 lb

## 2022-07-05 DIAGNOSIS — N949 Unspecified condition associated with female genital organs and menstrual cycle: Secondary | ICD-10-CM | POA: Diagnosis present

## 2022-07-05 DIAGNOSIS — N760 Acute vaginitis: Secondary | ICD-10-CM | POA: Insufficient documentation

## 2022-07-05 LAB — IGP,CTNGTV,APT HPV,RFX16/18,45
Chlamydia, Nuc. Acid Amp: NEGATIVE
Gonococcus, Nuc. Acid Amp: NEGATIVE
HPV Aptima: NEGATIVE
Trich vag by NAA: NEGATIVE

## 2022-07-05 NOTE — Progress Notes (Signed)
Pt presents for self swab due to vaginal concerns and vaginal symptoms. Pharmacy on file confirmed, medications and allergies reviewed. No questions at this time.

## 2022-07-06 ENCOUNTER — Ambulatory Visit
Admission: RE | Admit: 2022-07-06 | Discharge: 2022-07-06 | Disposition: A | Discharge status: Home / Self Care | Payer: Managed Care, Other (non HMO) | Source: Ambulatory Visit | Attending: Obstetrics | Admitting: Obstetrics

## 2022-07-06 DIAGNOSIS — Z8742 Personal history of other diseases of the female genital tract: Secondary | ICD-10-CM | POA: Diagnosis present

## 2022-07-06 DIAGNOSIS — N9489 Other specified conditions associated with female genital organs and menstrual cycle: Secondary | ICD-10-CM | POA: Insufficient documentation

## 2022-07-06 LAB — CERVICOVAGINAL ANCILLARY ONLY
Bacterial Vaginitis (gardnerella): POSITIVE — AB
Candida Glabrata: NEGATIVE
Candida Vaginitis: NEGATIVE
Comment: NEGATIVE
Comment: NEGATIVE
Comment: NEGATIVE

## 2022-07-11 ENCOUNTER — Encounter: Payer: Self-pay | Admitting: Obstetrics

## 2023-05-10 ENCOUNTER — Other Ambulatory Visit: Payer: Self-pay

## 2023-05-10 ENCOUNTER — Encounter: Payer: Self-pay | Admitting: Emergency Medicine

## 2023-05-10 ENCOUNTER — Emergency Department
Admission: EM | Admit: 2023-05-10 | Discharge: 2023-05-10 | Disposition: A | Discharge status: Home / Self Care | Payer: Managed Care, Other (non HMO) | Attending: Emergency Medicine | Admitting: Emergency Medicine

## 2023-05-10 ENCOUNTER — Emergency Department: Payer: Managed Care, Other (non HMO)

## 2023-05-10 DIAGNOSIS — R0789 Other chest pain: Secondary | ICD-10-CM | POA: Insufficient documentation

## 2023-05-10 LAB — CBC
HCT: 43.8 % (ref 36.0–46.0)
Hemoglobin: 14.2 g/dL (ref 12.0–15.0)
MCH: 29 pg (ref 26.0–34.0)
MCHC: 32.4 g/dL (ref 30.0–36.0)
MCV: 89.6 fL (ref 80.0–100.0)
Platelets: 262 10*3/uL (ref 150–400)
RBC: 4.89 MIL/uL (ref 3.87–5.11)
RDW: 14.2 % (ref 11.5–15.5)
WBC: 7 10*3/uL (ref 4.0–10.5)
nRBC: 0 % (ref 0.0–0.2)

## 2023-05-10 LAB — BASIC METABOLIC PANEL
Anion gap: 7 (ref 5–15)
BUN: 16 mg/dL (ref 6–20)
CO2: 21 mmol/L — ABNORMAL LOW (ref 22–32)
Calcium: 8.8 mg/dL — ABNORMAL LOW (ref 8.9–10.3)
Chloride: 107 mmol/L (ref 98–111)
Creatinine, Ser: 0.8 mg/dL (ref 0.44–1.00)
GFR, Estimated: >60 mL/min (ref >60)
Glucose, Bld: 116 mg/dL — ABNORMAL HIGH (ref 70–99)
Potassium: 4 mmol/L (ref 3.5–5.1)
Sodium: 135 mmol/L (ref 135–145)

## 2023-05-10 LAB — TROPONIN I (HIGH SENSITIVITY): Troponin I (High Sensitivity): <2 ng/L (ref <18)

## 2023-05-10 MED ORDER — NAPROXEN 375 MG PO TABS: 375.0000 mg | ORAL_TABLET | Freq: Two times a day (BID) | ORAL | 0 refills | Status: AC

## 2023-05-10 NOTE — ED Provider Notes (Signed)
[facility] Provider Note    Event Date/Time   First MD Initiated Contact with Patient 05/10/23 1019     (approximate)   History   Chest Pain   HPI  Teresa Bond is a 37 y.o. female here with chest pain.  The patient states that for the last week and a half, she has had progressively worsening positional, left-sided chest pain that radiates around her left flank and anterior chest.  The pain is worse with certain position and movement.  Is worse with deep breathing.  She states that she raked her yard and distributed mulch the day prior to the onset of symptoms.  However, this persisted much longer than she normally would.  She tried ibuprofen several times but only had 800 mg did not like the way it made her feel.  No other complaints.  No history of DVT or PE.  No recent mobilization.  She does smoke.     Physical Exam   Triage Vital Signs: ED Triage Vitals [05/10/23 1007]  Enc Vitals Group     BP 130/81     Pulse Rate 72     Resp 18     Temp 98.7 F (37.1 C)     Temp Source Oral     SpO2 100 %     Weight 169 lb 1.5 oz (76.7 kg)     Height 5\' 4"  (1.626 m)     Head Circumference      Peak Flow      Pain Score 7     Pain Loc      Pain Edu?      Excl. in GC?     Most recent vital signs: Vitals:   05/10/23 1007  BP: 130/81  Pulse: 72  Resp: 18  Temp: 98.7 F (37.1 C)  SpO2: 100%     General: Awake, no distress.  CV:  Good peripheral perfusion.  Regular rate and rhythm. Resp:  Normal work of breathing.  Lungs clear to auscultation. Abd:  No distention.  No tenderness. Other:  Moderate tenderness to palpation over the left lower parasternal intercostal space just lateral to the sternum.  This reproduces her reported pain.   ED Results / Procedures / Treatments   Labs (all labs ordered are listed, but only abnormal results are displayed) Labs Reviewed  BASIC METABOLIC PANEL - Abnormal; Notable for the following components:       Result Value   CO2 21 (*)    Glucose, Bld 116 (*)    Calcium 8.8 (*)    All other components within normal limits  CBC  POC URINE PREG, ED  TROPONIN I (HIGH SENSITIVITY)     EKG Normal sinus rhythm, ventricular rate 66.  PR 124, QRS 94, QTc 427.  No acute ST elevations or depressions.   RADIOLOGY Chest x-ray: No active disease   I also independently reviewed and agree with radiologist interpretations.   PROCEDURES:  Critical Care performed: No   MEDICATIONS ORDERED IN ED: Medications - No data to display   IMPRESSION / MDM / ASSESSMENT AND PLAN / ED COURSE  I reviewed the triage vital signs and the nursing notes.                              Differential diagnosis includes, but is not limited to, musculoskeletal chest pain, ACS, PE, pneumonia, pneumothorax, gastritis  Patient's presentation is most consistent  with acute presentation with potential threat to life or bodily function.  37 year old female with no significant past medical history here with reproducible, positional left-sided chest pain.  Suspect intercostal sprain/costochondritis.  The pain is reproducible on exam.  EKG is nonischemic and troponin negative despite symptoms greater than 1 week, do not suspect ACS.  Chest x-ray is clear with no evidence of pneumonia or pneumothorax.  Lab work is otherwise very reassuring.  Abdomen is soft and nontender.  Will place on NSAIDs and discharged with outpatient follow-up.     FINAL CLINICAL IMPRESSION(S) / ED DIAGNOSES   Final diagnoses:  Chest wall pain     Rx / DC Orders   ED Discharge Orders          Ordered    naproxen (NAPROSYN) 375 MG tablet  2 times daily with meals        05/10/23 1126             Note:  This document was prepared using Dragon voice recognition software and may include unintentional dictation errors.   Shaune Pollack, MD 05/10/23 1126

## 2023-05-10 NOTE — ED Triage Notes (Signed)
Pt here with cp since last Mon. Pt states pain is centered and does not radiate. Pt state pain is tight in nature and causing some tingling in her left arm. Pt stable in triage at this moment.
